# Patient Record
Sex: Male | Born: 1985 | Race: White | Hispanic: No | Marital: Married | Smoking: Never smoker
Health system: Southern US, Community
[De-identification: ages and names within clinical notes are randomized; demographics above are authoritative.]

## PROBLEM LIST (undated history)

## (undated) DIAGNOSIS — E079 Disorder of thyroid, unspecified: Secondary | ICD-10-CM

## (undated) DIAGNOSIS — F32A Depression, unspecified: Secondary | ICD-10-CM

## (undated) DIAGNOSIS — F329 Major depressive disorder, single episode, unspecified: Secondary | ICD-10-CM

## (undated) DIAGNOSIS — B019 Varicella without complication: Secondary | ICD-10-CM

## (undated) HISTORY — DX: Depression, unspecified: F32.A

## (undated) HISTORY — DX: Major depressive disorder, single episode, unspecified: F32.9

## (undated) HISTORY — DX: Varicella without complication: B01.9

## (undated) HISTORY — DX: Disorder of thyroid, unspecified: E07.9

---

## 2018-01-28 ENCOUNTER — Ambulatory Visit: Payer: Managed Care, Other (non HMO) | Admitting: Family Medicine

## 2018-01-28 ENCOUNTER — Encounter: Payer: Self-pay | Admitting: Family Medicine

## 2018-01-28 ENCOUNTER — Ambulatory Visit: Payer: Self-pay | Admitting: Family Medicine

## 2018-01-28 ENCOUNTER — Ambulatory Visit (INDEPENDENT_AMBULATORY_CARE_PROVIDER_SITE_OTHER): Payer: Managed Care, Other (non HMO)

## 2018-01-28 VITALS — BP 126/78 | HR 70 | Temp 97.9°F | Ht 69.0 in | Wt 237.8 lb

## 2018-01-28 DIAGNOSIS — M25572 Pain in left ankle and joints of left foot: Secondary | ICD-10-CM | POA: Diagnosis not present

## 2018-01-28 DIAGNOSIS — M79645 Pain in left finger(s): Secondary | ICD-10-CM

## 2018-01-28 DIAGNOSIS — Z803 Family history of malignant neoplasm of breast: Secondary | ICD-10-CM | POA: Insufficient documentation

## 2018-01-28 DIAGNOSIS — L4050 Arthropathic psoriasis, unspecified: Secondary | ICD-10-CM | POA: Insufficient documentation

## 2018-01-28 DIAGNOSIS — M25531 Pain in right wrist: Secondary | ICD-10-CM | POA: Diagnosis not present

## 2018-01-28 DIAGNOSIS — M25571 Pain in right ankle and joints of right foot: Secondary | ICD-10-CM

## 2018-01-28 DIAGNOSIS — E039 Hypothyroidism, unspecified: Secondary | ICD-10-CM | POA: Diagnosis not present

## 2018-01-28 DIAGNOSIS — M25532 Pain in left wrist: Secondary | ICD-10-CM | POA: Diagnosis not present

## 2018-01-28 DIAGNOSIS — G8929 Other chronic pain: Secondary | ICD-10-CM

## 2018-01-28 DIAGNOSIS — M2550 Pain in unspecified joint: Secondary | ICD-10-CM

## 2018-01-28 DIAGNOSIS — M542 Cervicalgia: Secondary | ICD-10-CM

## 2018-01-28 DIAGNOSIS — M79644 Pain in right finger(s): Secondary | ICD-10-CM

## 2018-01-28 LAB — TSH: TSH: 8.64 u[IU]/mL — AB (ref 0.35–4.50)

## 2018-01-28 MED ORDER — DICLOFENAC SODIUM 1 % TD GEL: TRANSDERMAL | 1 refills | Status: DC

## 2018-01-28 MED ORDER — LEVOTHYROXINE SODIUM 50 MCG PO TABS: 50.0000 ug | ORAL_TABLET | Freq: Every day | ORAL | 0 refills | Status: DC

## 2018-01-28 MED ORDER — DICLOFENAC SODIUM 75 MG PO TBEC: 75.0000 mg | DELAYED_RELEASE_TABLET | Freq: Two times a day (BID) | ORAL | 0 refills | Status: DC

## 2018-01-28 MED ORDER — LEVOTHYROXINE SODIUM 100 MCG PO TABS: 100.0000 ug | ORAL_TABLET | Freq: Every day | ORAL | 3 refills | Status: DC

## 2018-01-28 NOTE — Assessment & Plan Note (Signed)
No red flag signs or symptoms.  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  We will check plain films of his neck, hands, and ankles today.  I will start Voltaren gel for his hand pain.  I will also give diclofenac 75 mg twice daily as needed for his other joint pain.  Advised him to follow-up with sports medicine for for further evaluation and management.  This referral was placed today.

## 2018-01-28 NOTE — Assessment & Plan Note (Signed)
We will start back Synthroid at 50 mcg daily for the next 2 weeks.  Check TSH today.  Will likely need to increase back to 100 mcg daily in a couple of weeks if does well at 50 mcg dose.

## 2018-01-28 NOTE — Patient Instructions (Signed)
Start 50mcg of synthroid today. Do this for 2 weeks, then increase to 100mcg. We will check your thyroid level today.  We will also check xrays.  Use the diclofenac pill and gel as needed.  Please come back to see Dr Berline Choughigby soon.  Please come back at some point for your annual physical. I would like to see you once yearly.  Take care, Dr Jimmey RalphParker

## 2018-01-28 NOTE — Progress Notes (Signed)
Subjective:  Theodore Mcdonald is a 32 y.o. male who presents today with a chief complaint of hypothyroidism and to establish care.   HPI:  Patient has been in [place] for about 2 years.  Was previously living in [place].  Originally from [place], [place].  Currently works at [employer] where he works about 80 hours/week.  Hypothyroidism, new problem Diagnosed about 6 years ago.  Was last on Synthroid 100 mcg daily.  He has been off all medications for the past 3 months.  No chest pain or shortness of breath.  He has not noticed any sort of changes since being off all of his medications.  No decreased energy levels.  No constipation.  No hair or skin changes.  Polyarthralgia, new problem Patient with pain in his joints at several sites over the last several years.  Symptoms have been gradually worsening over the past couple years.  Notes pain in his neck that started a couple of years ago.  Also with some decreased range of motion in his neck.  He takes naproxen and ibuprofen as needed which help him with his pain.  Also notes pain in his bilateral hands and wrists that are worsened over the last year or 2.  His hand, finger, and wrist pain is worse with writing and with gripping.  Patient has very active at his work and does a lot of lifting.  He has recently had to modify his lifting technique to help him with his hand pain.  Patient additionally notes severe pain along the inner aspect of his ankles bilaterally.  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  Pain is also worsened over the last couple of years.  No weakness or numbness.  No fever or chills.  ROS: Per HPI, otherwise a complete review of systems was negative.   PMH:  The following were reviewed and entered/updated in epic: Past Medical History:  Diagnosis Date  . Thyroid disease    Patient Active Problem List   Diagnosis Date Noted  . Family history of  breast cancer 01/28/2018  . Hypothyroidism 01/28/2018  . Polyarthralgia 01/28/2018   History reviewed. No pertinent surgical history.  Family history of breast cancer. Was told needs mammogram.   Medications- reviewed and updated Current Outpatient Medications  Medication Sig Dispense Refill  . ibuprofen (ADVIL,MOTRIN) 800 MG tablet Take 800 mg by mouth every 8 (eight) hours as needed.    . naproxen (NAPROSYN) 250 MG tablet Take 250 mg by mouth daily as needed.    . diclofenac (VOLTAREN) 75 MG EC tablet Take 1 tablet (75 mg total) by mouth 2 (two) times daily. 30 tablet 0  . diclofenac sodium (VOLTAREN) 1 % GEL Use 4 times daily as needed 100 g 1  . levothyroxine (SYNTHROID, LEVOTHROID) 100 MCG tablet Take 1 tablet (100 mcg total) by mouth daily. 90 tablet 3  . levothyroxine (SYNTHROID, LEVOTHROID) 50 MCG tablet Take 1 tablet (50 mcg total) by mouth daily. 14 tablet 0   No current facility-administered medications for this visit.    Allergies-reviewed and updated No Known Allergies  Social History   Socioeconomic History  . Marital status: Married    Spouse name: None  . Number of children: 4  . Years of education: None  . Highest education level: None  Social Needs  . Financial resource strain: None  . Food insecurity - worry: None  . Food insecurity - inability: None  .  Transportation needs - medical: None  . Transportation needs - non-medical: None  Occupational History  . None  Tobacco Use  . Smoking status: Never Smoker  . Smokeless tobacco: Never Used  Substance and Sexual Activity  . Alcohol use: Yes  . Drug use: No  . Sexual activity: Yes    Partners: Female  Other Topics Concern  . None  Social History Narrative  . None   Objective:  Physical Exam: BP 126/78 (BP Location: Left Arm, Patient Position: Sitting, Cuff Size: Normal)   Pulse 70   Temp 97.9 F (36.6 C) (Oral)   Ht 5\' 9"  (1.753 m)   Wt 237 lb 12.8 oz (107.9 kg)   SpO2 98%   BMI 35.12 kg/m    Gen: NAD, resting comfortably CV: RRR with no murmurs appreciated Pulm: NWOB, CTAB with no crackles, wheezes, or rhonchi GI: Normal bowel sounds present. Soft, Nontender, Nondistended. MSK:  -Neck: No deformities.  Mildly tender to palpation along paraspinal muscles bilaterally.  Normal extension and flexion.  Somewhat limited with leftward and rightward rotation. -Hands: No deformities.  Tender to palpation along first [facility] joint and second CMP joint bilaterally.  Pain worsened with joint loading.  Thumb-index finger opposition testing intact bilaterally.  Normal strength with thumb extension bilaterally.  Finkelstein negative bilaterally.  Neurovascularly intact distally. -Ankles: No deformities.  Tender to palpation along medial joint line bilaterally.  Anterior drawer sign negative bilaterally.  Talar tilt negative bilaterally. Skin: Warm, dry Neuro: Grossly normal, moves all extremities Psych: Normal affect and thought content  Assessment/Plan:  Hypothyroidism We will start back Synthroid at 50 mcg daily for the next 2 weeks.  Check TSH today.  Will likely need to increase back to 100 mcg daily in a couple of weeks if does well at 50 mcg dose.  Polyarthralgia No red flag signs or symptoms.  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  We will check plain films of his neck, hands, and ankles today.  I will start Voltaren gel for his hand pain.  I will also give diclofenac 75 mg twice daily as needed for his other joint pain.  Advised him to follow-up with sports medicine for for further evaluation and management.  This referral was placed today.  Preventative healthcare Patient recently had cholesterol screening done at work.  He will bring these records to the office.  He will follow-up soon for his CPE.  Katina Degree. Jimmey Ralph, MD 01/28/2018 2:57 PM

## 2018-02-13 ENCOUNTER — Other Ambulatory Visit: Payer: Self-pay | Admitting: Family Medicine

## 2018-02-20 ENCOUNTER — Other Ambulatory Visit: Payer: Self-pay

## 2018-02-20 MED ORDER — DICLOFENAC SODIUM 1 % TD GEL: TRANSDERMAL | 0 refills | Status: DC

## 2018-03-16 ENCOUNTER — Other Ambulatory Visit: Payer: Self-pay | Admitting: Family Medicine

## 2018-03-28 ENCOUNTER — Other Ambulatory Visit: Payer: Self-pay | Admitting: Family Medicine

## 2018-05-08 ENCOUNTER — Other Ambulatory Visit: Payer: Self-pay | Admitting: Family Medicine

## 2018-06-08 ENCOUNTER — Other Ambulatory Visit: Payer: Self-pay | Admitting: Family Medicine

## 2018-06-26 IMAGING — DX DG CERVICAL SPINE COMPLETE 4+V
6 series · 6 of 6 positions shown · non-contrast
Comparison: None.

CLINICAL DATA: Neck pain with decreased range of motion for several
months

EXAM:
CERVICAL SPINE - COMPLETE 4+ VIEW

[cervical spine lat (1 of 2)]
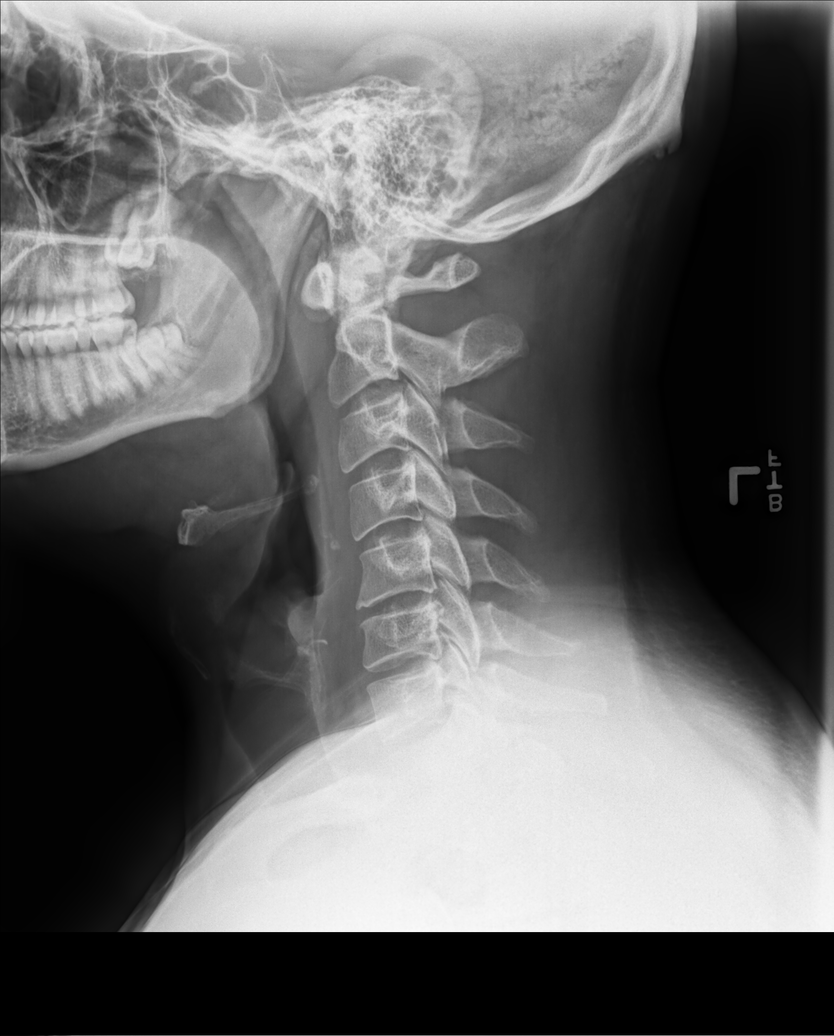

[cervical spine oblique (1 of 2)]
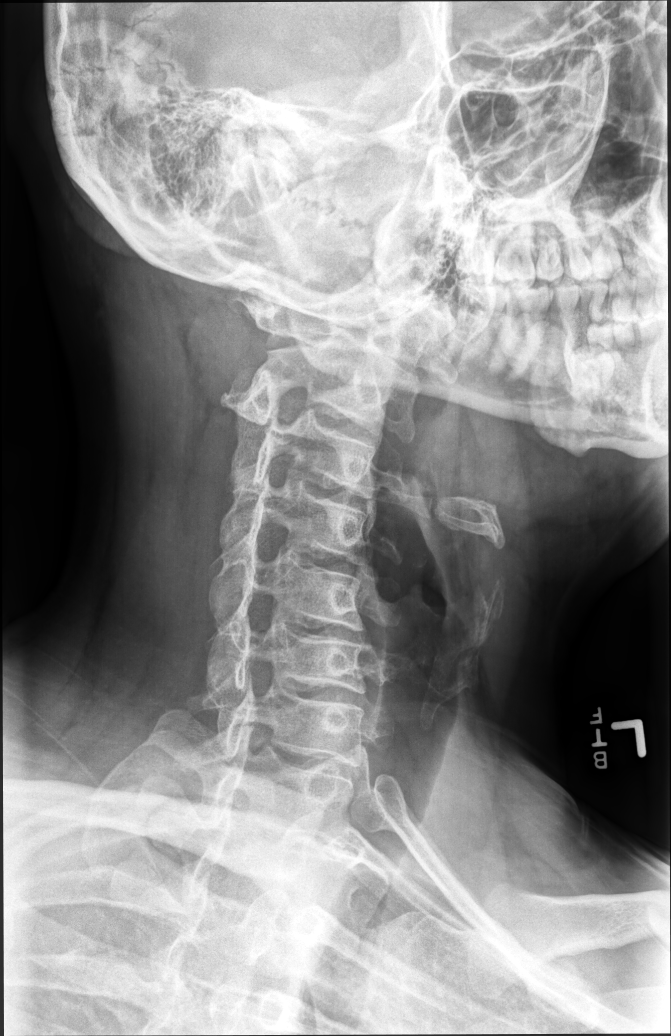

[cervical spine oblique (2 of 2)]
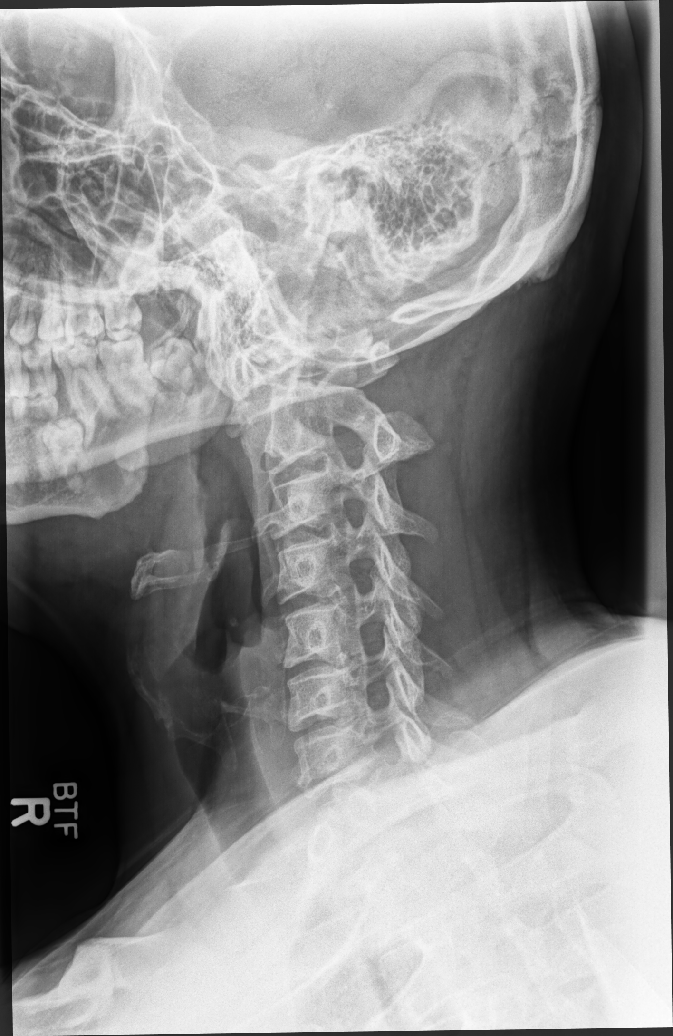

[cervical spine ap (1 of 2)]
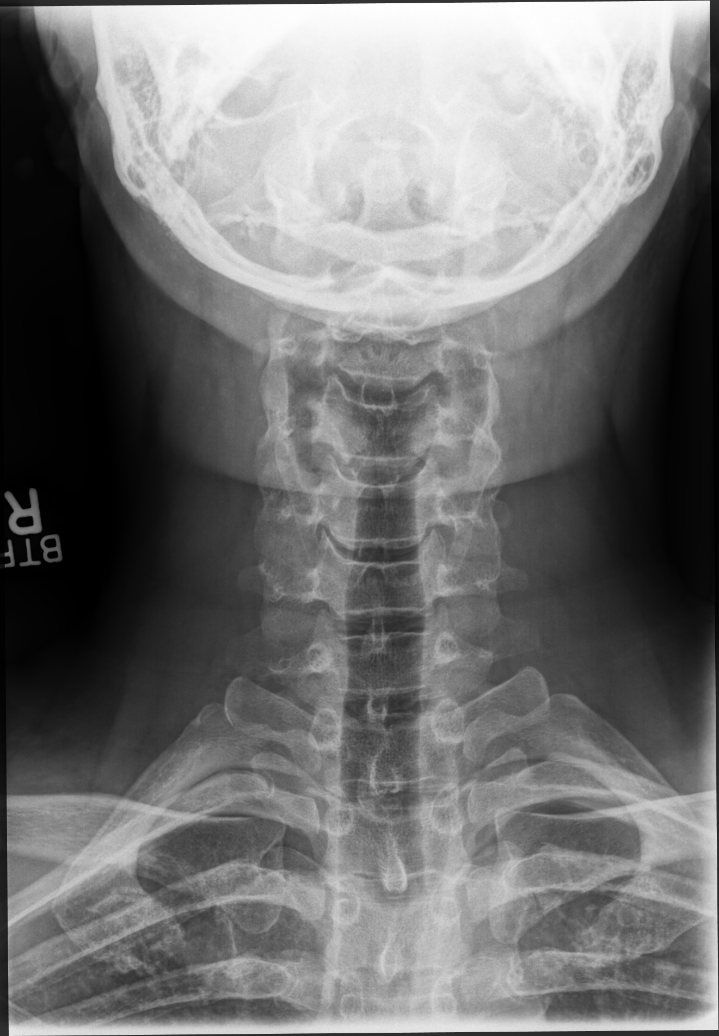

[cervical spine ap (2 of 2)]
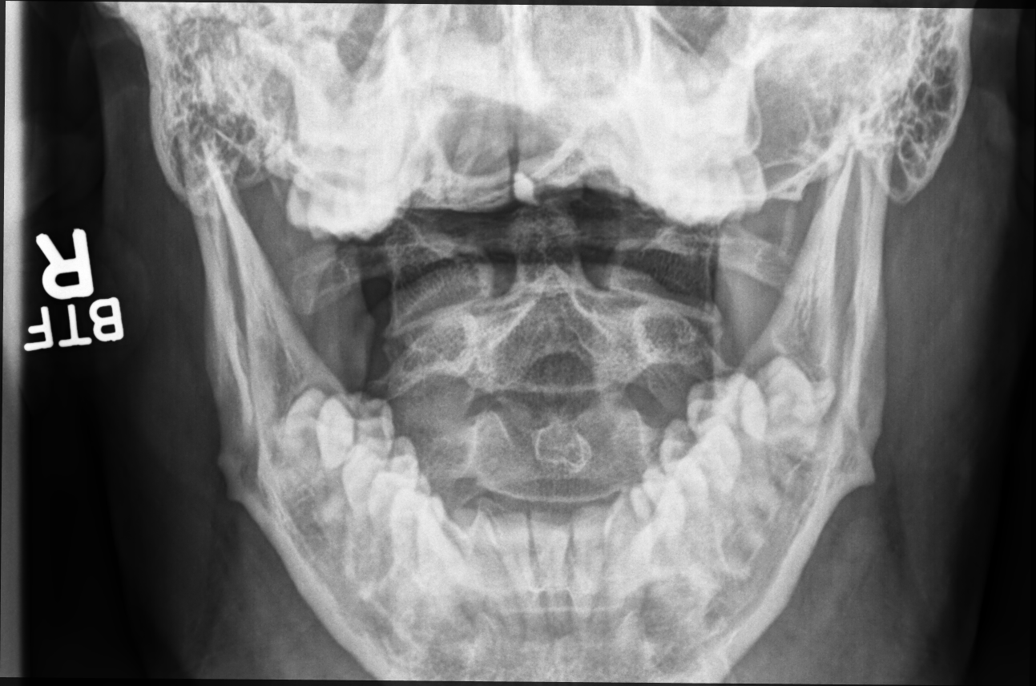

[cervical spine lat (2 of 2)]
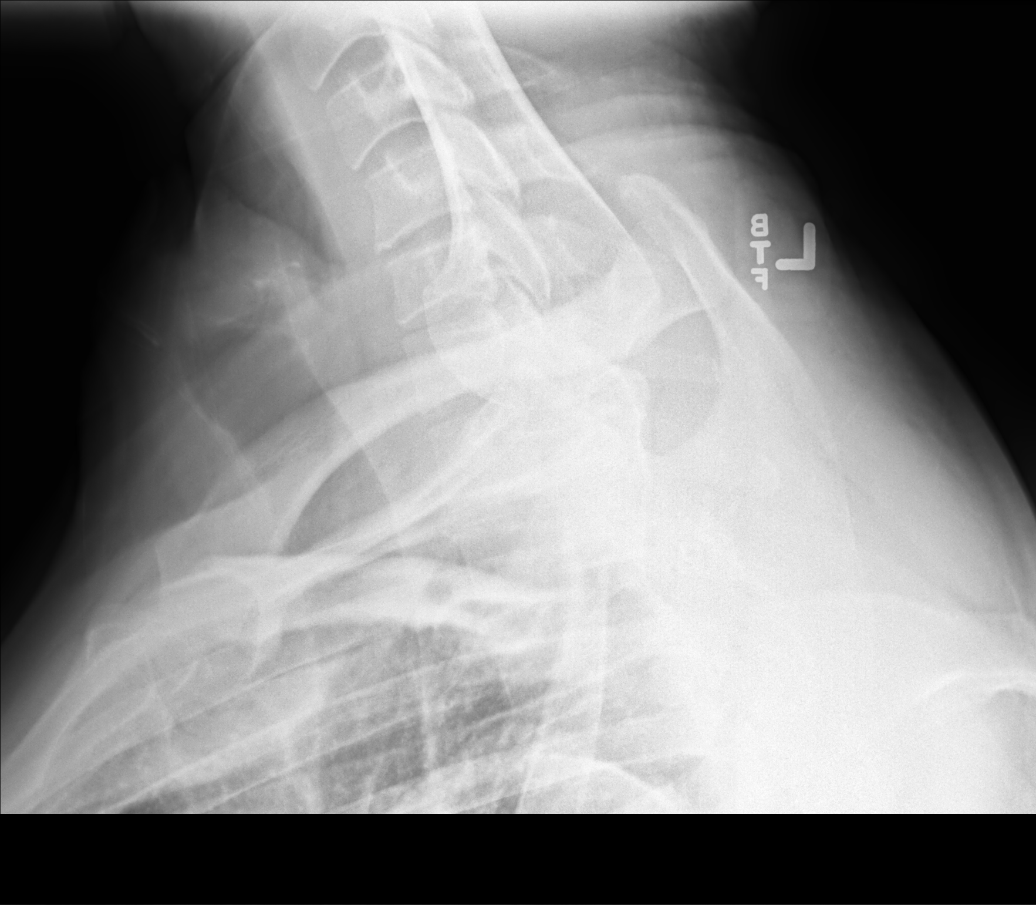

[6 of 6 positions shown; findings below may reference images not displayed]

FINDINGS: Straightening of the cervical spine. Prevertebral soft tissue
thickness upper limits of normal. Vertebral body heights are normal.
Mild degenerative changes at C5-C6 and C6-C7. The dens and lateral
masses are within normal limits.
IMPRESSION: Mild degenerative changes at C5-C6 and C6-C7.

## 2018-06-26 IMAGING — DX DG HAND COMPLETE 3+V*R*
3 series · 3 of 3 positions shown · non-contrast
Comparison: None.

CLINICAL DATA: Pain in the hands for several years most severe in
the thumbs and index finger

EXAM:
RIGHT HAND - COMPLETE 3+ VIEW

[hand pa]
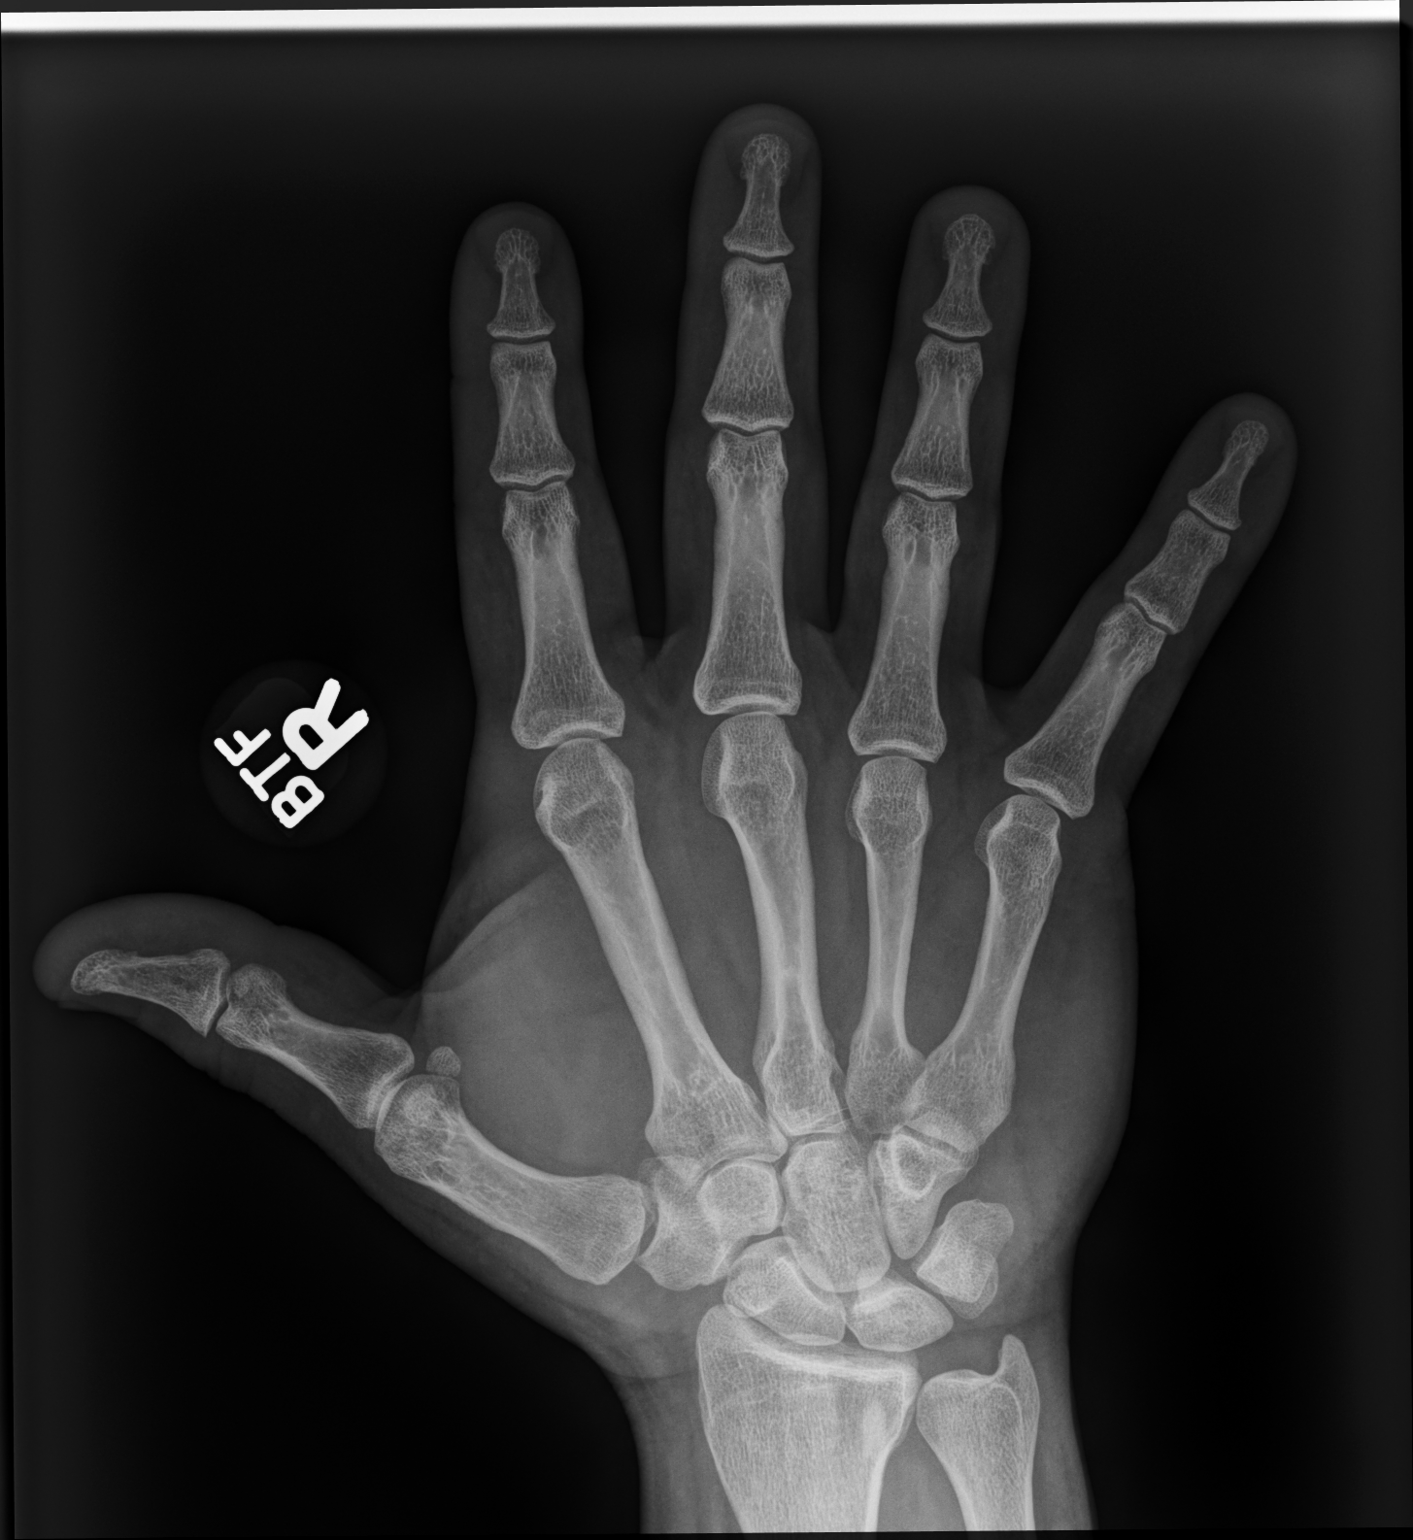

[hand oblique]
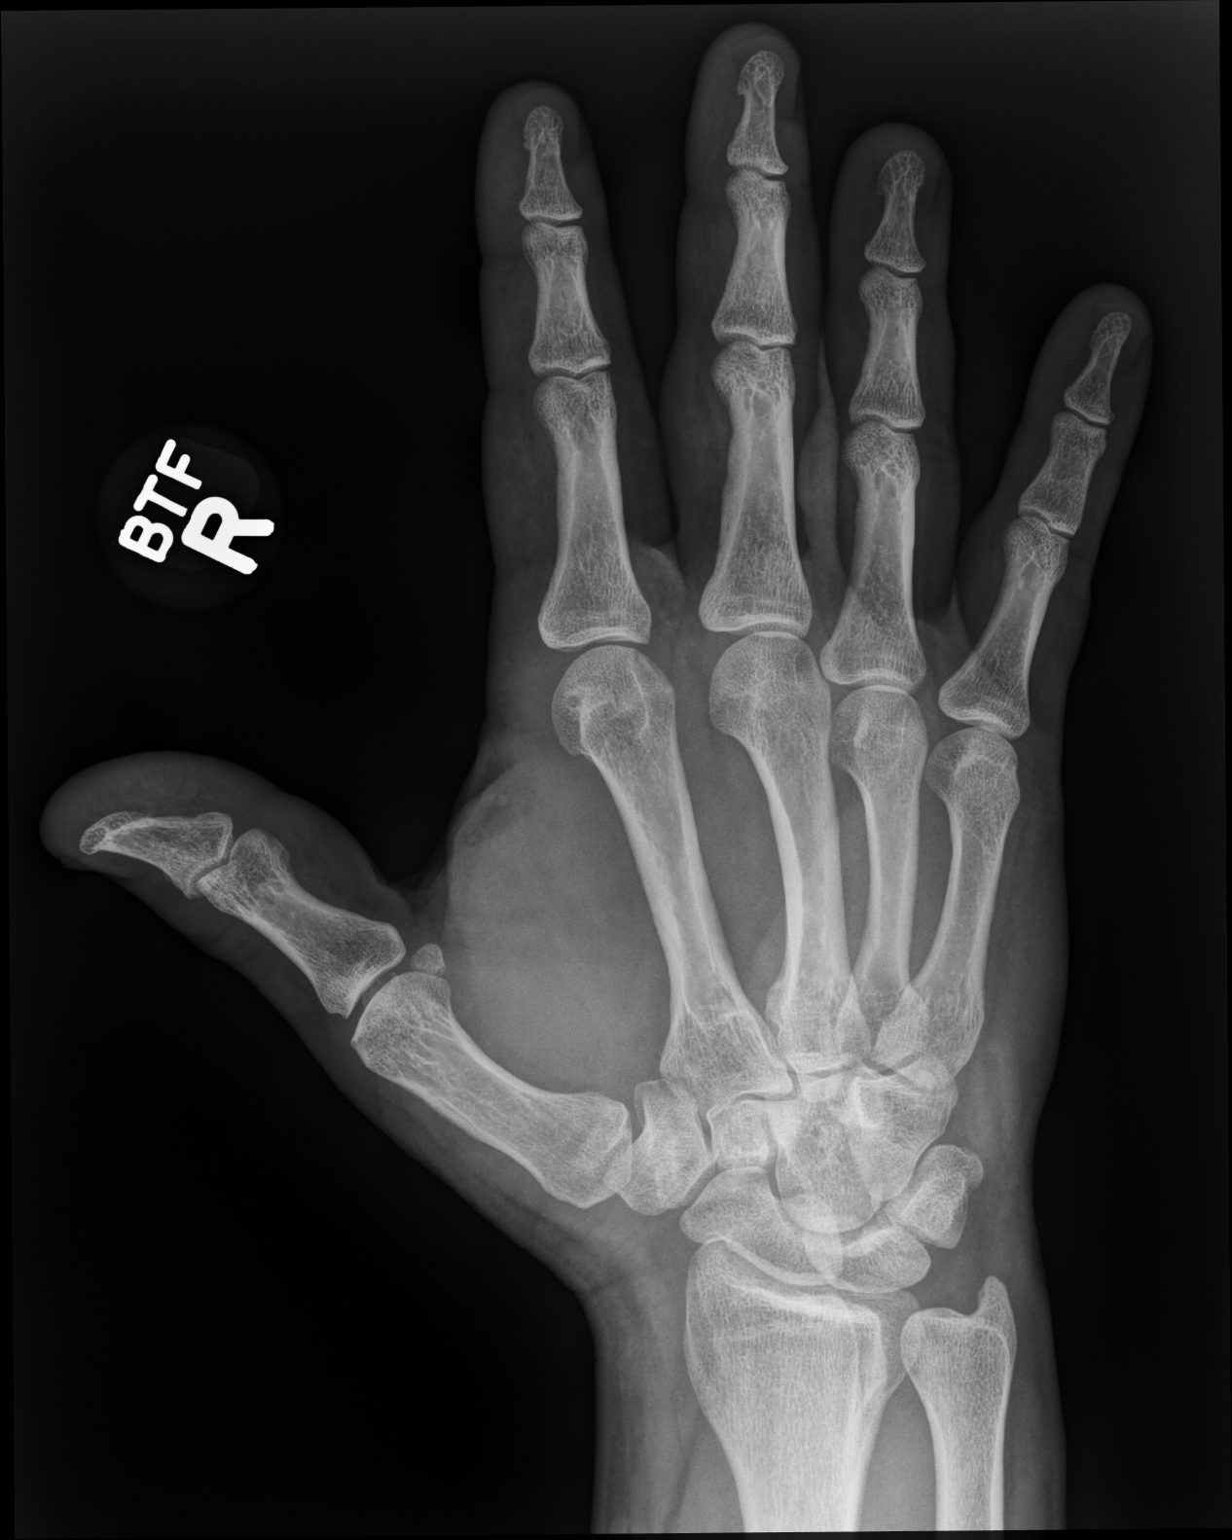

[hand lat]
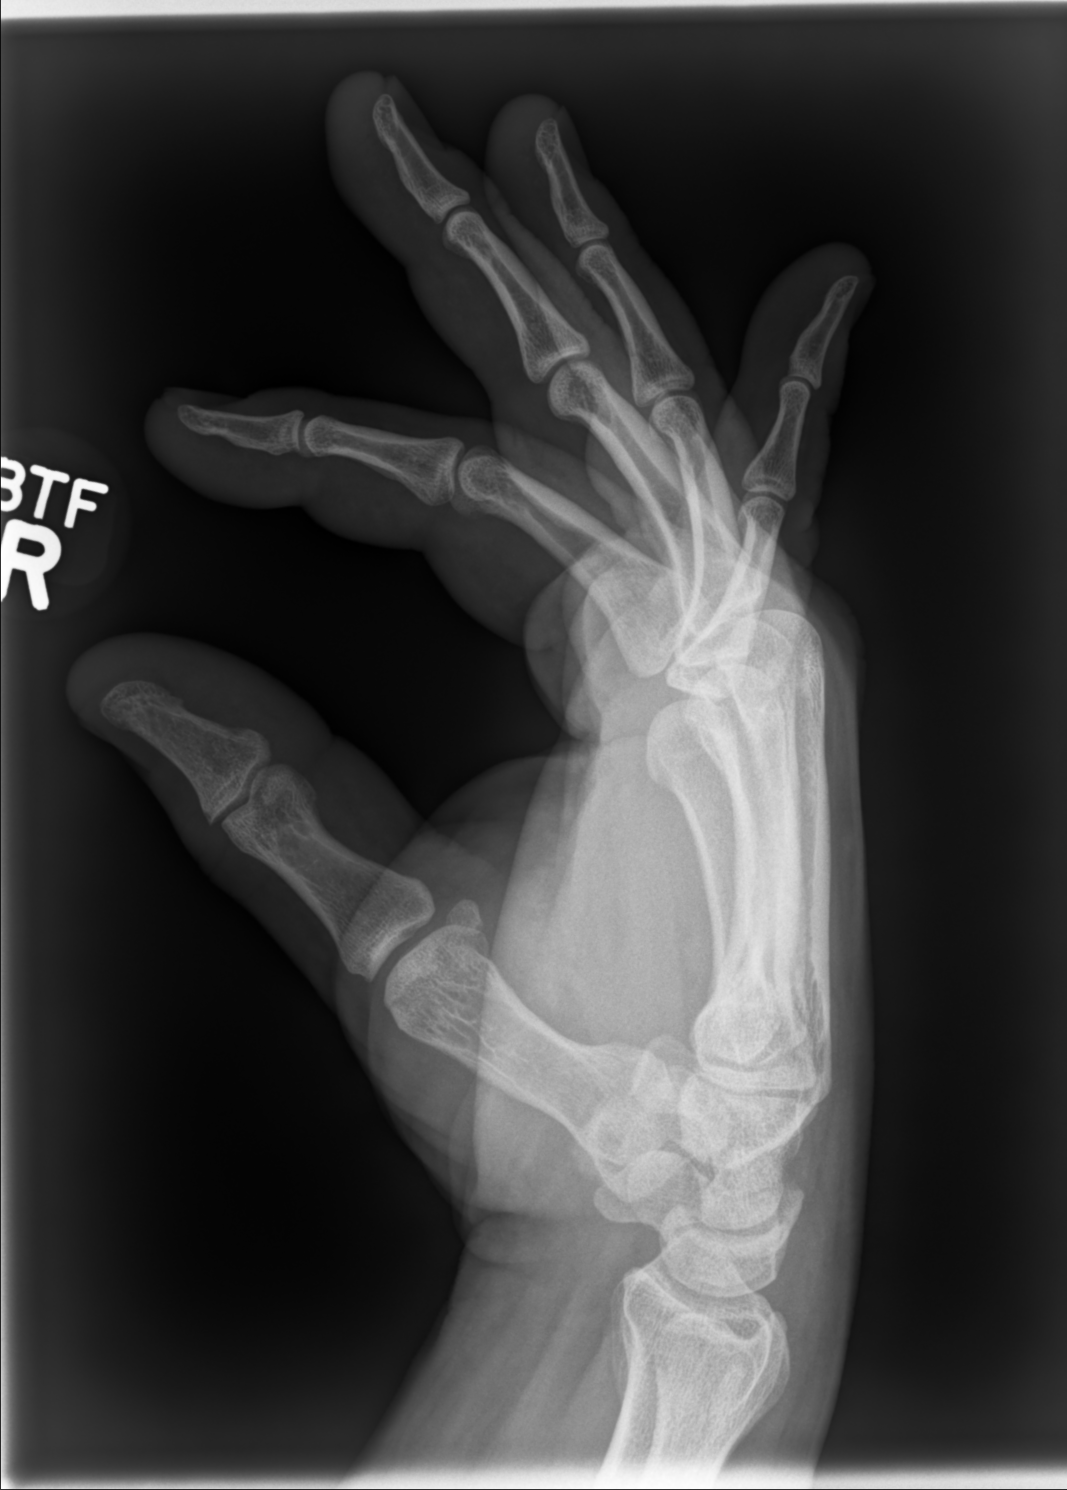

[3 of 3 positions shown; findings below may reference images not displayed]

FINDINGS: There is no evidence of fracture or dislocation. There is no
evidence of arthropathy or other focal bone abnormality. Soft
tissues are unremarkable.
IMPRESSION: Negative.

## 2018-07-01 ENCOUNTER — Encounter: Payer: Self-pay | Admitting: Family Medicine

## 2018-07-01 ENCOUNTER — Ambulatory Visit: Payer: Managed Care, Other (non HMO) | Admitting: Family Medicine

## 2018-07-01 VITALS — BP 122/74 | HR 71 | Temp 98.5°F | Ht 69.0 in | Wt 246.0 lb

## 2018-07-01 DIAGNOSIS — M2550 Pain in unspecified joint: Secondary | ICD-10-CM

## 2018-07-01 DIAGNOSIS — E039 Hypothyroidism, unspecified: Secondary | ICD-10-CM | POA: Diagnosis not present

## 2018-07-01 LAB — COMPREHENSIVE METABOLIC PANEL
ALT: 31 U/L (ref 0–53)
AST: 16 U/L (ref 0–37)
Albumin: 4.3 g/dL (ref 3.5–5.2)
Alkaline Phosphatase: 54 U/L (ref 39–117)
BUN: 16 mg/dL (ref 6–23)
CALCIUM: 9.6 mg/dL (ref 8.4–10.5)
CHLORIDE: 103 meq/L (ref 96–112)
CO2: 29 mEq/L (ref 19–32)
Creatinine, Ser: 0.94 mg/dL (ref 0.40–1.50)
GFR: 97.37 mL/min (ref >60.00)
Glucose, Bld: 104 mg/dL — ABNORMAL HIGH (ref 70–99)
POTASSIUM: 4.3 meq/L (ref 3.5–5.1)
Sodium: 140 mEq/L (ref 135–145)
Total Bilirubin: 0.7 mg/dL (ref 0.2–1.2)
Total Protein: 6.9 g/dL (ref 6.0–8.3)

## 2018-07-01 LAB — CBC
HEMATOCRIT: 40.7 % (ref 39.0–52.0)
HEMOGLOBIN: 13.9 g/dL (ref 13.0–17.0)
MCHC: 34.3 g/dL (ref 30.0–36.0)
MCV: 88.4 fl (ref 78.0–100.0)
PLATELETS: 287 10*3/uL (ref 150.0–400.0)
RBC: 4.6 Mil/uL (ref 4.22–5.81)
RDW: 13.6 % (ref 11.5–15.5)
WBC: 7.4 10*3/uL (ref 4.0–10.5)

## 2018-07-01 LAB — C-REACTIVE PROTEIN: CRP: 2.5 mg/dL (ref 0.5–20.0)

## 2018-07-01 LAB — URIC ACID: Uric Acid, Serum: 6.5 mg/dL (ref 4.0–7.8)

## 2018-07-01 LAB — TSH: TSH: 5.12 u[IU]/mL — ABNORMAL HIGH (ref 0.35–4.50)

## 2018-07-01 LAB — SEDIMENTATION RATE: SED RATE: 21 mm/h — AB (ref 0–15)

## 2018-07-01 NOTE — Progress Notes (Signed)
   Subjective:  Theodore Mcdonald is a 32 y.o. male who presents today with a chief complaint of hypothyroidism.   HPI:  Hypothyroidism, chronic problem Patient seen about 5 months ago for this.  Had elevated TSH to 8.23 and we increased his Synthroid dose to 100 mcg daily.  He has done well this dose.  No reported side effects.  Polyarthralgia, chronic problem, worsening Patient also seen about 5 months ago for this.  Unfortunately, he has had worsening pain in his hands bilaterally.  He has had significant stiffness and swelling to the area as well.  Sometimes difficult to make a fist.  He was prescribed diclofenac by mouth as well as diclofenac gel at his last office visit which worked well, however he does not take daily as he does not like to take daily medications.  No rashes.  No fevers or chills.  Symptoms have progressed to the point where they are interfering with his work and ability to perform activities of daily living.  For example, he had to have his wife help him put on socks due to the swelling and pain in his hands.  He notices pain gets worse when he does not take his anti-inflammatories.  Pain is also worse first thing in the morning.  ROS: Per HPI  PMH: He reports that he has never smoked. He has never used smokeless tobacco. He reports that he drinks alcohol. He reports that he does not use drugs.  Objective:  Physical Exam: BP 122/74 (BP Location: Left Arm, Patient Position: Sitting, Cuff Size: Large)   Pulse 71   Temp 98.5 F (36.9 C) (Oral)   Ht 5\' 9"  (1.753 m)   Wt 246 lb (111.6 kg)   SpO2 98%   BMI 36.33 kg/m   Gen: NAD, resting comfortably CV: RRR with no murmurs appreciated Pulm: NWOB, CTAB with no crackles, wheezes, or rhonchi MSK:  -Hands: No deformities.  Tender to palpation along all metacarpophalangeal joints, most prominent at third MCP joint bilaterally.  Strength intact throughout.  Neurovascular intact distally.  Assessment/Plan:   Polyarthralgia Unclear etiology.  His x-rays performed several months ago did not have any signs of significant degenerative changes.  We will check blood work today to rule out other causes.  Check ANA, CBC, CMET, sed rate, CRP, uric acid, and rheumatoid factor.  He will follow-up with sports medicine within the next few weeks for further evaluation and treatment.  Voltaren 75 mg twice daily and Voltaren topical gel as needed.  Also recommended Tylenol 1000 mg 3 times daily.  Patient also stated he would be trying CBD oil.  Hypothyroidism Continue Synthroid 100 mcg daily.  Check TSH today.  Katina Degreealeb M. Jimmey RalphParker, MD 07/01/2018 9:11 AM

## 2018-07-01 NOTE — Patient Instructions (Signed)
It was very nice to see you today!  We will check blood work to make sure there is nothing else going.   We will continue your current dose of thyroid medication.    Please take 1000 mg of Tylenol 3 times daily as needed.  Please continue your other anti-inflammatories as needed.  Please come back to see Dr. Berline Choughigby soon.  Take care, Dr Jimmey RalphParker

## 2018-07-01 NOTE — Assessment & Plan Note (Signed)
Continue Synthroid 100 mcg daily.  Check TSH today. 

## 2018-07-01 NOTE — Assessment & Plan Note (Addendum)
Unclear etiology.  His x-rays performed several months ago did not have any signs of significant degenerative changes.  We will check blood work today to rule out other causes.  Check ANA, CBC, CMET, sed rate, CRP, uric acid, and rheumatoid factor.  He will follow-up with sports medicine within the next few weeks for further evaluation and treatment.  Voltaren 75 mg twice daily and Voltaren topical gel as needed.  Also recommended Tylenol 1000 mg 3 times daily.  Patient also stated he would be trying CBD oil.

## 2018-07-03 LAB — ANA: ANA: NEGATIVE

## 2018-07-03 LAB — RHEUMATOID FACTOR

## 2018-07-08 NOTE — Progress Notes (Signed)
Please inform patient of the following:  His thyroid test is still a little high. We should increase his synthroid to 112mcg daily. I would like to check again in 4-6 weeks. Please send in new Rx if needed.   The rest of his blood work is essentially normal. I do not have a clear diagnosis for his hand pains. I would like for him to see Dr Berline Choughigby for further evaluation and management.  Katina Degreealeb M. Jimmey RalphParker, MD 07/08/2018 1:01 PM

## 2018-07-09 ENCOUNTER — Other Ambulatory Visit: Payer: Self-pay

## 2018-07-09 DIAGNOSIS — E039 Hypothyroidism, unspecified: Secondary | ICD-10-CM

## 2018-07-09 MED ORDER — LEVOTHYROXINE SODIUM 112 MCG PO TABS: 112.0000 ug | ORAL_TABLET | Freq: Every day | ORAL | 3 refills | Status: DC

## 2018-07-16 ENCOUNTER — Other Ambulatory Visit: Payer: Self-pay | Admitting: Family Medicine

## 2018-08-13 ENCOUNTER — Other Ambulatory Visit: Payer: Self-pay | Admitting: Family Medicine

## 2018-08-19 ENCOUNTER — Other Ambulatory Visit: Payer: Managed Care, Other (non HMO)

## 2018-08-21 ENCOUNTER — Other Ambulatory Visit (INDEPENDENT_AMBULATORY_CARE_PROVIDER_SITE_OTHER): Payer: Managed Care, Other (non HMO)

## 2018-08-21 DIAGNOSIS — E039 Hypothyroidism, unspecified: Secondary | ICD-10-CM | POA: Diagnosis not present

## 2018-08-21 LAB — TSH: TSH: 7.37 u[IU]/mL — AB (ref 0.35–4.50)

## 2018-08-26 NOTE — Progress Notes (Signed)
Please inform patient of the following:  Thyroid number is still high. Please increase synthroid to 125mcg and we can recheck in 4-6 weeks.  Katina Degreealeb M. Jimmey RalphParker, MD 08/26/2018 8:06 AM

## 2018-08-28 ENCOUNTER — Other Ambulatory Visit: Payer: Self-pay

## 2018-09-04 ENCOUNTER — Other Ambulatory Visit: Payer: Self-pay

## 2018-09-04 DIAGNOSIS — E039 Hypothyroidism, unspecified: Secondary | ICD-10-CM

## 2018-09-14 ENCOUNTER — Other Ambulatory Visit: Payer: Self-pay | Admitting: Family Medicine

## 2018-10-17 ENCOUNTER — Other Ambulatory Visit: Payer: Self-pay | Admitting: Family Medicine

## 2019-01-01 ENCOUNTER — Other Ambulatory Visit: Payer: Self-pay | Admitting: Family Medicine

## 2019-01-29 ENCOUNTER — Other Ambulatory Visit: Payer: Self-pay | Admitting: Family Medicine

## 2019-02-20 ENCOUNTER — Telehealth: Payer: Self-pay | Admitting: *Deleted

## 2019-02-20 ENCOUNTER — Encounter: Payer: Self-pay | Admitting: Physician Assistant

## 2019-02-20 ENCOUNTER — Ambulatory Visit (INDEPENDENT_AMBULATORY_CARE_PROVIDER_SITE_OTHER): Payer: 59 | Admitting: Physician Assistant

## 2019-02-20 VITALS — BP 118/78 | HR 96 | Temp 98.9°F

## 2019-02-20 DIAGNOSIS — M2550 Pain in unspecified joint: Secondary | ICD-10-CM

## 2019-02-20 DIAGNOSIS — R059 Cough, unspecified: Secondary | ICD-10-CM

## 2019-02-20 DIAGNOSIS — R05 Cough: Secondary | ICD-10-CM | POA: Diagnosis not present

## 2019-02-20 MED ORDER — AZITHROMYCIN 250 MG PO TABS: ORAL_TABLET | ORAL | 0 refills | Status: DC

## 2019-02-20 NOTE — Patient Instructions (Addendum)
It was great to see you!  Flu and strep test negative.  Start azithromycin for bronchitis.   Push fluids and get plenty of rest. Please return if you are not improving as expected, or if you have high fevers (>101.5) or difficulty swallowing or worsening productive cough.  Call clinic with questions.  I hope you start feeling better soon!

## 2019-02-20 NOTE — Progress Notes (Signed)
Theodore Mcdonald is a 33 y.o. male here for a new problem.  History of Present Illness:   Chief Complaint  Patient presents with  . Cough    HPI   Patient with cough, congestion, sore throat.   Denies: fevers, chills, body aches. Good appetite. Still working. Has good energy. History of bronchitis. Former smoker.  Has tried nothing for his cough.  3 weeks ago his child came home from an outing and was sick. Since then, everyone in the family has been sick. Denies any known COVID-19 contacts.  He is also here asking about a referral to rheumatology for his RA. States that it is getting significantly worse with time. And his current medication regimen is not managing it well.  Past Medical History:  Diagnosis Date  . Chicken pox   . Depression   . Thyroid disease      Social History   Socioeconomic History  . Marital status: Married    Spouse name: Not on file  . Number of children: 4  . Years of education: Not on file  . Highest education level: Not on file  Occupational History  . Not on file  Social Needs  . Financial resource strain: Not on file  . Food insecurity:    Worry: Not on file    Inability: Not on file  . Transportation needs:    Medical: Not on file    Non-medical: Not on file  Tobacco Use  . Smoking status: Never Smoker  . Smokeless tobacco: Never Used  Substance and Sexual Activity  . Alcohol use: Yes  . Drug use: No  . Sexual activity: Yes    Partners: Female  Lifestyle  . Physical activity:    Days per week: Not on file    Minutes per session: Not on file  . Stress: Not on file  Relationships  . Social connections:    Talks on phone: Not on file    Gets together: Not on file    Attends religious service: Not on file    Active member of club or organization: Not on file    Attends meetings of clubs or organizations: Not on file    Relationship status: Not on file  . Intimate partner violence:    Fear of current or ex partner: Not on  file    Emotionally abused: Not on file    Physically abused: Not on file    Forced sexual activity: Not on file  Other Topics Concern  . Not on file  Social History Narrative  . Not on file    No past surgical history on file.  Family History  Problem Relation Age of Onset  . Arthritis Mother   . Cancer Mother   . Depression Mother   . Diabetes Mother   . Arthritis Father   . Depression Father   . Mental illness Father   . Miscarriages / Stillbirths Sister   . Arthritis Maternal Grandmother   . Cancer Maternal Grandmother   . Diabetes Maternal Grandmother   . Heart disease Maternal Grandmother   . Cancer Maternal Grandfather   . Cancer Paternal Grandmother   . Diabetes Paternal Grandmother   . Arthritis Paternal Grandfather   . Cancer Paternal Grandfather   . COPD Paternal Grandfather     No Known Allergies  Current Medications:   Current Outpatient Medications:  .  diclofenac (VOLTAREN) 75 MG EC tablet, TAKE 1 TABLET BY MOUTH TWICE A DAY, Disp: 60 tablet, Rfl:  0 .  levothyroxine (SYNTHROID, LEVOTHROID) 112 MCG tablet, Take 1 tablet (112 mcg total) by mouth daily., Disp: 90 tablet, Rfl: 3 .  naproxen (NAPROSYN) 250 MG tablet, Take 250 mg by mouth daily as needed., Disp: , Rfl:  .  azithromycin (ZITHROMAX Z-PAK) 250 MG tablet, Take 2 tablets ( total of 500 mg) PO on day 1, then 1 tablet ( total of 250 mg) PO q24 x 4 days., Disp: 6 each, Rfl: 0 .  diclofenac sodium (VOLTAREN) 1 % GEL, USE 4 TIMES DAILY AS NEEDED (Patient not taking: Reported on 02/20/2019), Disp: 300 g, Rfl: 0 .  ibuprofen (ADVIL,MOTRIN) 800 MG tablet, Take 800 mg by mouth every 8 (eight) hours as needed., Disp: , Rfl:    Review of Systems:   Review of Systems  Constitutional: Negative for chills, fever, malaise/fatigue and weight loss.  HENT: Positive for congestion and sinus pain. Negative for ear pain.   Respiratory: Positive for cough. Negative for shortness of breath.   Cardiovascular: Negative  for chest pain, orthopnea, claudication and leg swelling.  Gastrointestinal: Negative for heartburn, nausea and vomiting.  Genitourinary: Negative for dysuria, frequency and urgency.  Neurological: Negative for dizziness, tingling and headaches.    Vitals:   Vitals:   02/20/19 1544  BP: 118/78  Pulse: 96  Temp: 98.9 F (37.2 C)  SpO2: 97%     There is no height or weight on file to calculate BMI.  Physical Exam:   Physical Exam Vitals signs and nursing note reviewed.  Constitutional:      General: He is not in acute distress.    Appearance: He is well-developed. He is not ill-appearing or toxic-appearing.  HENT:     Head: Normocephalic and atraumatic.     Right Ear: Tympanic membrane, ear canal and external ear normal. Tympanic membrane is not erythematous, retracted or bulging.     Left Ear: Tympanic membrane, ear canal and external ear normal. Tympanic membrane is not erythematous, retracted or bulging.     Nose: Nasal tenderness, congestion and rhinorrhea present.     Right Sinus: No maxillary sinus tenderness or frontal sinus tenderness.     Left Sinus: No maxillary sinus tenderness or frontal sinus tenderness.     Mouth/Throat:     Lips: Pink.     Mouth: Mucous membranes are moist.     Pharynx: Uvula midline. Posterior oropharyngeal erythema present.  Eyes:     General: Lids are normal.     Conjunctiva/sclera: Conjunctivae normal.  Neck:     Trachea: Trachea normal.  Cardiovascular:     Rate and Rhythm: Normal rate and regular rhythm.     Pulses: Normal pulses.     Heart sounds: Normal heart sounds, S1 normal and S2 normal.     Comments: No LE edema Pulmonary:     Effort: Pulmonary effort is normal.     Breath sounds: Normal breath sounds. No decreased breath sounds, wheezing, rhonchi or rales.  Lymphadenopathy:     Cervical: No cervical adenopathy.  Skin:    General: Skin is warm and dry.  Neurological:     Mental Status: He is alert.     GCS: GCS eye  subscore is 4. GCS verbal subscore is 5. GCS motor subscore is 6.  Psychiatric:        Speech: Speech normal.        Behavior: Behavior normal. Behavior is cooperative.     Results for orders placed or performed in visit on 08/21/18  TSH  Result Value Ref Range   TSH 7.37 (H) 0.35 - 4.50 uIU/mL    Assessment and Plan:   Duell was seen today for cough.  Diagnoses and all orders for this visit:  Polyarthralgia -     Ambulatory referral to Rheumatology  Cough No red flags on exam.  Will initiate azithromycin per orders. Discussed taking medications as prescribed. Reviewed return precautions including worsening fever, SOB, worsening cough or other concerns. Push fluids and rest. I recommend that patient follow-up if symptoms worsen or persist despite treatment x 7-10 days, sooner if needed.  Other orders -     azithromycin (ZITHROMAX Z-PAK) 250 MG tablet; Take 2 tablets ( total of 500 mg) PO on day 1, then 1 tablet ( total of 250 mg) PO q24 x 4 days.  . Reviewed expectations re: course of current medical issues. . Discussed self-management of symptoms. . Outlined signs and symptoms indicating need for more acute intervention. . Patient verbalized understanding and all questions were answered. . See orders for this visit as documented in the electronic medical record. . Patient received an After-Visit Summary.  Jarold Motto, PA-C

## 2019-02-20 NOTE — Telephone Encounter (Signed)
Left message calling regarding appt today at 3:20. Please come to the side door on the left side of the building, ring the bell and we will come out and get you. Any questions please call office.

## 2019-02-23 ENCOUNTER — Other Ambulatory Visit: Payer: Self-pay | Admitting: Family Medicine

## 2019-02-24 ENCOUNTER — Other Ambulatory Visit: Payer: Self-pay

## 2019-02-24 ENCOUNTER — Other Ambulatory Visit: Payer: Self-pay | Admitting: Family Medicine

## 2019-02-24 ENCOUNTER — Telehealth: Payer: Self-pay | Admitting: *Deleted

## 2019-02-24 ENCOUNTER — Other Ambulatory Visit: Payer: Self-pay | Admitting: Physician Assistant

## 2019-02-24 ENCOUNTER — Encounter: Payer: Self-pay | Admitting: Physician Assistant

## 2019-02-24 DIAGNOSIS — R059 Cough, unspecified: Secondary | ICD-10-CM

## 2019-02-24 DIAGNOSIS — R05 Cough: Secondary | ICD-10-CM

## 2019-02-24 MED ORDER — DICLOFENAC SODIUM 75 MG PO TBEC: 75.0000 mg | DELAYED_RELEASE_TABLET | Freq: Two times a day (BID) | ORAL | 0 refills | Status: DC

## 2019-02-24 NOTE — Telephone Encounter (Signed)
Please call patient and review that I would like for him to be tested at the screening site at the [facility] building because he needs documentation that he is safe to return to work. I have sent him the following MyChart message, please make sure he doesn't have any questions:  Because the situation is rapidly evolving in [facility] and we now have at least one confirmed case, I think it would be best to screen you for COVID-19 prior to sending you back to work.   I am going to put in an order for you to be screened, this can be done at [address].   There is a drive-up station collection site at that address and you can tell them that your provider has put an order for you to be tested.   I have reviewed this plan with Dr. Jimmey Ralph and he is in agreement to this.

## 2019-02-24 NOTE — Telephone Encounter (Signed)
Spoke to pt asked him if he saw his My Chart message from [place]? Pt said yes. Asked him if he had any questions? Pt said he just doesn't understanding because he has not had a fever. Told pt I am sorry but according to guidelines now this is what we have to do. Also you will have results back in 24-72 hours. Pt verbalized understanding.

## 2019-02-27 ENCOUNTER — Encounter: Payer: Self-pay | Admitting: Physician Assistant

## 2019-03-02 ENCOUNTER — Ambulatory Visit: Payer: Self-pay | Admitting: *Deleted

## 2019-03-02 NOTE — Telephone Encounter (Signed)
Pt called requesting results of the coronavirus. No results noted in chart. Pt informed that the results show be going into his MyChart.  Advised to give it a few more days and call the office back if no results. Pt voiced understanding.

## 2019-03-02 NOTE — Telephone Encounter (Signed)
See note

## 2019-03-03 LAB — NOVEL CORONAVIRUS, NAA: SARS-CoV-2, NAA: NOT DETECTED

## 2019-03-03 NOTE — Telephone Encounter (Signed)
Please call patient.  I know that he is currently awaiting results for COVID swab to return to work.  There are now new guidelines regarding this.  Please ask the following questions: 1. When was the last time patient had fever (>100.4)? 2. Are cough and other respiratory symptoms improved?  If the answer to number 1 is >3 days ago AND the answer to number 2 is yes, he may return to work.  If he needs documentation for this for work, please ask what he needs so we can provide this for him.  We will be in touch with his swab result as well.  Jarold Motto PA-C

## 2019-03-03 NOTE — Telephone Encounter (Signed)
Spoke to pt asked if he saw My Chart note about swab still pending? Pt said yes. Told pt Lelon Mast said she knows that you are currently awaiting results for COVID swab to return to work.  There are now new guidelines regarding this.  Please ask the following questions: 1. When was the last time patient had fever (>100.4)? Pt said he never had fever this whole time. 2. Are cough and other respiratory symptoms improved? Pt said he is 100% better, no symptoms.  Told pt okay you may return to work being as you are symptom free. Do you need documentation for your work? Pt said yes, he will need letter to return to work. Told pt okay do you want to pick up? Pt said no please put letter in My Chart so he can get it from there. Told pt okay and we will be in touch with his swab result as soon as we get it. Pt verbalized understanding.

## 2019-03-03 NOTE — Telephone Encounter (Signed)
Please see message pt does need letter for work.

## 2019-03-03 NOTE — Telephone Encounter (Signed)
Patient will be notified when results are available.

## 2019-04-19 ENCOUNTER — Other Ambulatory Visit: Payer: Self-pay | Admitting: Family Medicine

## 2019-05-14 ENCOUNTER — Other Ambulatory Visit: Payer: Self-pay | Admitting: Family Medicine

## 2019-05-14 NOTE — Telephone Encounter (Signed)
Last OV 02/20/19 Last refill 04/20/19 #60/0 Next OV not scheduled

## 2019-07-13 ENCOUNTER — Other Ambulatory Visit: Payer: Self-pay

## 2019-07-13 MED ORDER — LEVOTHYROXINE SODIUM 112 MCG PO TABS: 112.0000 ug | ORAL_TABLET | Freq: Every day | ORAL | 3 refills | Status: DC

## 2019-09-07 ENCOUNTER — Other Ambulatory Visit: Payer: Self-pay | Admitting: Family Medicine

## 2019-12-02 ENCOUNTER — Other Ambulatory Visit: Payer: Self-pay

## 2019-12-02 MED ORDER — DICLOFENAC SODIUM 75 MG PO TBEC: 75.0000 mg | DELAYED_RELEASE_TABLET | Freq: Two times a day (BID) | ORAL | 0 refills | Status: DC

## 2019-12-18 ENCOUNTER — Encounter: Payer: Self-pay | Admitting: Family Medicine

## 2019-12-21 ENCOUNTER — Ambulatory Visit: Payer: No Typology Code available for payment source | Attending: Internal Medicine

## 2020-10-04 ENCOUNTER — Other Ambulatory Visit: Payer: Self-pay | Admitting: Family Medicine

## 2020-10-20 ENCOUNTER — Other Ambulatory Visit: Payer: Self-pay

## 2020-10-20 ENCOUNTER — Encounter: Payer: Self-pay | Admitting: Family Medicine

## 2020-10-20 ENCOUNTER — Ambulatory Visit (INDEPENDENT_AMBULATORY_CARE_PROVIDER_SITE_OTHER): Payer: No Typology Code available for payment source | Admitting: Family Medicine

## 2020-10-20 VITALS — BP 107/64 | HR 60 | Temp 98.2°F | Ht 69.0 in | Wt 230.0 lb

## 2020-10-20 DIAGNOSIS — Z803 Family history of malignant neoplasm of breast: Secondary | ICD-10-CM | POA: Diagnosis not present

## 2020-10-20 DIAGNOSIS — Z6833 Body mass index (BMI) 33.0-33.9, adult: Secondary | ICD-10-CM

## 2020-10-20 DIAGNOSIS — E669 Obesity, unspecified: Secondary | ICD-10-CM

## 2020-10-20 DIAGNOSIS — Z0001 Encounter for general adult medical examination with abnormal findings: Secondary | ICD-10-CM | POA: Diagnosis not present

## 2020-10-20 DIAGNOSIS — Z13220 Encounter for screening for lipoid disorders: Secondary | ICD-10-CM

## 2020-10-20 DIAGNOSIS — R739 Hyperglycemia, unspecified: Secondary | ICD-10-CM

## 2020-10-20 DIAGNOSIS — L4050 Arthropathic psoriasis, unspecified: Secondary | ICD-10-CM

## 2020-10-20 DIAGNOSIS — E039 Hypothyroidism, unspecified: Secondary | ICD-10-CM | POA: Diagnosis not present

## 2020-10-20 NOTE — Assessment & Plan Note (Signed)
Check A1c.  Continue lifestyle modifications. 

## 2020-10-20 NOTE — Patient Instructions (Signed)
It was very nice to see you today!  I am glad that you are doing well.  We will check blood work today.  I will see back in year for your next physical.  Please come back to see me sooner if needed.  Take care, Dr Jerline Pain  Please try these tips to maintain a healthy lifestyle:   Eat at least 3 REAL meals and 1-2 snacks per day.  Aim for no more than 5 hours between eating.  If you eat breakfast, please do so within one hour of getting up.    Each meal should contain half fruits/vegetables, one quarter protein, and one quarter carbs (no bigger than a computer mouse)   Cut down on sweet beverages. This includes juice, soda, and sweet tea.     Drink at least 1 glass of water with each meal and aim for at least 8 glasses per day   Exercise at least 150 minutes every week.    Preventive Care 3-61 Years Old, Male Preventive care refers to lifestyle choices and visits with your health care provider that can promote health and wellness. This includes:  A yearly physical exam. This is also called an annual well check.  Regular dental and eye exams.  Immunizations.  Screening for certain conditions.  Healthy lifestyle choices, such as eating a healthy diet, getting regular exercise, not using drugs or products that contain nicotine and tobacco, and limiting alcohol use. What can I expect for my preventive care visit? Physical exam Your health care provider will check:  Height and weight. These may be used to calculate body mass index (BMI), which is a measurement that tells if you are at a healthy weight.  Heart rate and blood pressure.  Your skin for abnormal spots. Counseling Your health care provider may ask you questions about:  Alcohol, tobacco, and drug use.  Emotional well-being.  Home and relationship well-being.  Sexual activity.  Eating habits.  Work and work Statistician. What immunizations do I need?  Influenza (flu) vaccine  This is recommended  every year. Tetanus, diphtheria, and pertussis (Tdap) vaccine  You may need a Td booster every 10 years. Varicella (chickenpox) vaccine  You may need this vaccine if you have not already been vaccinated. Human papillomavirus (HPV) vaccine  If recommended by your health care provider, you may need three doses over 6 months. Measles, mumps, and rubella (MMR) vaccine  You may need at least one dose of MMR. You may also need a second dose. Meningococcal conjugate (MenACWY) vaccine  One dose is recommended if you are 81-60 years old and a Market researcher living in a residence hall, or if you have one of several medical conditions. You may also need additional booster doses. Pneumococcal conjugate (PCV13) vaccine  You may need this if you have certain conditions and were not previously vaccinated. Pneumococcal polysaccharide (PPSV23) vaccine  You may need one or two doses if you smoke cigarettes or if you have certain conditions. Hepatitis A vaccine  You may need this if you have certain conditions or if you travel or work in places where you may be exposed to hepatitis A. Hepatitis B vaccine  You may need this if you have certain conditions or if you travel or work in places where you may be exposed to hepatitis B. Haemophilus influenzae type b (Hib) vaccine  You may need this if you have certain risk factors. You may receive vaccines as individual doses or as more than one vaccine  together in one shot (combination vaccines). Talk with your health care provider about the risks and benefits of combination vaccines. What tests do I need? Blood tests  Lipid and cholesterol levels. These may be checked every 5 years starting at age 20.  Hepatitis C test.  Hepatitis B test. Screening   Diabetes screening. This is done by checking your blood sugar (glucose) after you have not eaten for a while (fasting).  Sexually transmitted disease (STD) testing. Talk with your health  care provider about your test results, treatment options, and if necessary, the need for more tests. Follow these instructions at home: Eating and drinking   Eat a diet that includes fresh fruits and vegetables, whole grains, lean protein, and low-fat dairy products.  Take vitamin and mineral supplements as recommended by your health care provider.  Do not drink alcohol if your health care provider tells you not to drink.  If you drink alcohol: ? Limit how much you have to 0-2 drinks a day. ? Be aware of how much alcohol is in your drink. In the [place] one drink equals one 12 oz bottle of beer (355 mL), one 5 oz glass of wine (148 mL), or one 1 oz glass of hard liquor (44 mL). Lifestyle  Take daily care of your teeth and gums.  Stay active. Exercise for at least 30 minutes on 5 or more days each week.  Do not use any products that contain nicotine or tobacco, such as cigarettes, e-cigarettes, and chewing tobacco. If you need help quitting, ask your health care provider.  If you are sexually active, practice safe sex. Use a condom or other form of protection to prevent STIs (sexually transmitted infections). What's next?  Go to your health care provider once a year for a well check visit.  Ask your health care provider how often you should have your eyes and teeth checked.  Stay up to date on all vaccines. This information is not intended to replace advice given to you by your health care provider. Make sure you discuss any questions you have with your health care provider. Document Revised: 11/20/2018 Document Reviewed: 11/20/2018 Elsevier Patient Education  2020 Elsevier Inc.  

## 2020-10-20 NOTE — Assessment & Plan Note (Signed)
Check TSH.  Continue levothyroxine 112 mcg daily. 

## 2020-10-20 NOTE — Assessment & Plan Note (Addendum)
Very strong family history.  No relative diagnosed at age 36.  Will place referral to oncology when he is 38.  Discussed routine self breast exams.

## 2020-10-20 NOTE — Assessment & Plan Note (Addendum)
Follows with rheumatology. Was on cosentix.  Check labs today.

## 2020-10-20 NOTE — Progress Notes (Signed)
Chief Complaint:  Theodore Mcdonald is a 34 y.o. male who presents today for his annual comprehensive physical exam.    Assessment/Plan:  Chronic Problems Addressed Today: Psoriatic arthritis (HCC) Follows with rheumatology. Was on cosentix.  Check labs today.  Hypothyroidism Check TSH. Continue levothyroxine daily.   Hyperglycemia Check A1c.  Continue lifestyle modifications.  Family history of breast cancer Very strong family history.  No relative diagnosed at age 54.  Will place referral to oncology when he is 38.  Discussed routine self breast exams.   Body mass index is 33.97 kg/m. / Obese  BMI Metric Follow Up - 10/20/20 1036      BMI Metric Follow Up-Please document annually   BMI Metric Follow Up Education provided            Preventative Healthcare: Check CBC, CMET, TSH, lipid panel.  Patient Counseling(The following topics were reviewed and/or handout was given):  -Nutrition: Stressed importance of moderation in sodium/caffeine intake, saturated fat and cholesterol, caloric balance, sufficient intake of fresh fruits, vegetables, and fiber.  -Stressed the importance of regular exercise.   -Substance Abuse: Discussed cessation/primary prevention of tobacco, alcohol, or other drug use; driving or other dangerous activities under the influence; availability of treatment for abuse.   -Injury prevention: Discussed safety belts, safety helmets, smoke detector, smoking near bedding or upholstery.   -Sexuality: Discussed sexually transmitted diseases, partner selection, use of condoms, avoidance of unintended pregnancy and contraceptive alternatives.   -Dental health: Discussed importance of regular tooth brushing, flossing, and dental visits.  -Health maintenance and immunizations reviewed. Please refer to Health maintenance section.  Return to care in 1 year for next preventative visit.     Subjective:  HPI:  He has no acute complaints today.    Lifestyle Diet: Balanced.  Working on portion control. Cut out sodas.  Exercise: Doing more exercise.   Depression screen [facility] 2/9 10/20/2020  Decreased Interest 0  Down, Depressed, Hopeless 0  PHQ - 2 Score 0  Altered sleeping 1  Tired, decreased energy 1  Change in appetite 0  Feeling bad or failure about yourself  0  Trouble concentrating 1  Moving slowly or fidgety/restless 0  Suicidal thoughts 0  PHQ-9 Score 3  Difficult doing work/chores Not difficult at all   Health Maintenance Due  Topic Date Due  . Hepatitis C Screening  Never done  . HIV Screening  Never done     ROS: Per HPI, otherwise a complete review of systems was negative.   PMH:  The following were reviewed and entered/updated in epic: Past Medical History:  Diagnosis Date  . Chicken pox   . Depression   . Thyroid disease    Patient Active Problem List   Diagnosis Date Noted  . Hyperglycemia 10/20/2020  . Family history of breast cancer 01/28/2018  . Hypothyroidism 01/28/2018  . Psoriatic arthritis (HCC) 01/28/2018   History reviewed. No pertinent surgical history.  Family History  Problem Relation Age of Onset  . Arthritis Mother   . Cancer Mother   . Depression Mother   . Diabetes Mother   . Arthritis Father   . Depression Father   . Mental illness Father   . Miscarriages / Stillbirths Sister   . Arthritis Maternal Grandmother   . Cancer Maternal Grandmother   . Diabetes Maternal Grandmother   . Heart disease Maternal Grandmother   . Cancer Maternal Grandfather   . Cancer Paternal Grandmother   . Diabetes Paternal Grandmother   .  Arthritis Paternal Grandfather   . Cancer Paternal Grandfather   . COPD Paternal Grandfather     Medications- reviewed and updated Current Outpatient Medications  Medication Sig Dispense Refill  . levothyroxine (SYNTHROID) 112 MCG tablet Take 1 tablet (112 mcg total) by mouth daily. 90 tablet 3   No current facility-administered medications for this  visit.    Allergies-reviewed and updated No Known Allergies  Social History   Socioeconomic History  . Marital status: Married    Spouse name: Not on file  . Number of children: 4  . Years of education: Not on file  . Highest education level: Not on file  Occupational History  . Not on file  Tobacco Use  . Smoking status: Never Smoker  . Smokeless tobacco: Never Used  Substance and Sexual Activity  . Alcohol use: Yes  . Drug use: No  . Sexual activity: Yes    Partners: Female  Other Topics Concern  . Not on file  Social History Narrative  . Not on file   Social Determinants of Health   Financial Resource Strain:   . Difficulty of Paying Living Expenses: Not on file  Food Insecurity:   . Worried About Programme researcher, broadcasting/film/video in the Last Year: Not on file  . Ran Out of Food in the Last Year: Not on file  Transportation Needs:   . Lack of Transportation (Medical): Not on file  . Lack of Transportation (Non-Medical): Not on file  Physical Activity:   . Days of Exercise per Week: Not on file  . Minutes of Exercise per Session: Not on file  Stress:   . Feeling of Stress : Not on file  Social Connections:   . Frequency of Communication with Friends and Family: Not on file  . Frequency of Social Gatherings with Friends and Family: Not on file  . Attends Religious Services: Not on file  . Active Member of Clubs or Organizations: Not on file  . Attends Banker Meetings: Not on file  . Marital Status: Not on file        Objective:  Physical Exam: BP 107/64   Pulse 60   Temp 98.2 F (36.8 C) (Temporal)   Ht 5\' 9"  (1.753 m)   Wt 230 lb (104.3 kg)   SpO2 99%   BMI 33.97 kg/m   Body mass index is 33.97 kg/m. Wt Readings from Last 3 Encounters:  10/20/20 230 lb (104.3 kg)  07/01/18 246 lb (111.6 kg)  01/28/18 237 lb 12.8 oz (107.9 kg)   Gen: NAD, resting comfortably HEENT: TMs normal bilaterally. OP clear. No thyromegaly noted.  CV: RRR with no  murmurs appreciated Pulm: NWOB, CTAB with no crackles, wheezes, or rhonchi GI: Normal bowel sounds present. Soft, Nontender, Nondistended. MSK: no edema, cyanosis, or clubbing noted Skin: warm, dry Neuro: CN2-12 grossly intact. Strength 5/5 in upper and lower extremities. Reflexes symmetric and intact bilaterally.  Psych: Normal affect and thought content     Lindamarie Maclachlan M. 01/30/18, MD 10/20/2020 10:38 AM

## 2020-10-21 LAB — HEMOGLOBIN A1C
Hgb A1c MFr Bld: 5.3 % of total Hgb (ref <5.7)
Mean Plasma Glucose: 105 (calc)
eAG (mmol/L): 5.8 (calc)

## 2020-10-21 LAB — LIPID PANEL
Cholesterol: 164 mg/dL (ref <200)
HDL: 51 mg/dL (ref >=40)
LDL Cholesterol (Calc): 90 mg/dL (calc)
Non-HDL Cholesterol (Calc): 113 mg/dL (calc) (ref <130)
Total CHOL/HDL Ratio: 3.2 (calc) (ref <5.0)
Triglycerides: 134 mg/dL (ref <150)

## 2020-10-21 LAB — COMPREHENSIVE METABOLIC PANEL
AG Ratio: 2 (calc) (ref 1.0–2.5)
ALT: 25 U/L (ref 9–46)
AST: 22 U/L (ref 10–40)
Albumin: 4.6 g/dL (ref 3.6–5.1)
Alkaline phosphatase (APISO): 49 U/L (ref 36–130)
BUN: 13 mg/dL (ref 7–25)
CO2: 30 mmol/L (ref 20–32)
Calcium: 9.9 mg/dL (ref 8.6–10.3)
Chloride: 103 mmol/L (ref 98–110)
Creat: 0.92 mg/dL (ref 0.60–1.35)
Globulin: 2.3 g/dL (calc) (ref 1.9–3.7)
Glucose, Bld: 89 mg/dL (ref 65–99)
Potassium: 4.2 mmol/L (ref 3.5–5.3)
Sodium: 139 mmol/L (ref 135–146)
Total Bilirubin: 0.8 mg/dL (ref 0.2–1.2)
Total Protein: 6.9 g/dL (ref 6.1–8.1)

## 2020-10-21 LAB — CBC
HCT: 42.8 % (ref 38.5–50.0)
Hemoglobin: 14.7 g/dL (ref 13.2–17.1)
MCH: 31 pg (ref 27.0–33.0)
MCHC: 34.3 g/dL (ref 32.0–36.0)
MCV: 90.3 fL (ref 80.0–100.0)
MPV: 10.2 fL (ref 7.5–12.5)
Platelets: 252 10*3/uL (ref 140–400)
RBC: 4.74 10*6/uL (ref 4.20–5.80)
RDW: 12.8 % (ref 11.0–15.0)
WBC: 5.8 10*3/uL (ref 3.8–10.8)

## 2020-10-21 LAB — TSH: TSH: 3.32 mIU/L (ref 0.40–4.50)

## 2020-10-21 NOTE — Progress Notes (Signed)
Please inform patient of the following:  Blood work all NORMAL. Would like for him to continue with current treatment plan and we can recheck again in year or so.  Katina Degree. Jimmey Ralph, MD 10/21/2020 7:56 AM

## 2021-01-02 LAB — NOVEL CORONAVIRUS, NAA: SARS-CoV-2, NAA: NEGATIVE

## 2021-01-09 ENCOUNTER — Encounter: Payer: Self-pay | Admitting: Family Medicine

## 2021-03-12 ENCOUNTER — Other Ambulatory Visit: Payer: Self-pay | Admitting: Family Medicine

## 2021-04-07 ENCOUNTER — Encounter: Payer: Self-pay | Admitting: Family Medicine

## 2021-06-28 ENCOUNTER — Other Ambulatory Visit: Payer: Self-pay | Admitting: Family Medicine

## 2021-07-03 LAB — CBC AND DIFFERENTIAL
HCT: 42 (ref 41–53)
Hemoglobin: 14.1 (ref 13.5–17.5)
Neutrophils Absolute: 4.4
Platelets: 281 (ref 150–399)
WBC: 6.7

## 2021-07-03 LAB — HEPATIC FUNCTION PANEL
ALT: 23 (ref 10–40)
AST: 30 (ref 14–40)
Alkaline Phosphatase: 52 (ref 25–125)
Bilirubin, Total: 1.1

## 2021-07-03 LAB — BASIC METABOLIC PANEL
BUN: 20 (ref 4–21)
CO2: 32 — AB (ref 13–22)
Chloride: 99 (ref 99–108)
Creatinine: 1 (ref 0.6–1.3)
Glucose: 85
Potassium: 4.2 (ref 3.4–5.3)
Sodium: 139 (ref 137–147)

## 2021-07-03 LAB — COMPREHENSIVE METABOLIC PANEL
Albumin: 5.1 — AB (ref 3.5–5.0)
Calcium: 10.6 (ref 8.7–10.7)
GFR calc non Af Amer: 95

## 2021-07-03 LAB — CBC: RBC: 4.61 (ref 3.87–5.11)

## 2021-07-20 ENCOUNTER — Encounter: Payer: Self-pay | Admitting: Family Medicine

## 2021-11-05 ENCOUNTER — Other Ambulatory Visit: Payer: Self-pay | Admitting: Family Medicine

## 2022-05-31 ENCOUNTER — Other Ambulatory Visit: Payer: Self-pay | Admitting: Family Medicine

## 2022-05-31 ENCOUNTER — Encounter: Payer: Self-pay | Admitting: Family Medicine

## 2022-06-01 ENCOUNTER — Other Ambulatory Visit: Payer: Self-pay

## 2022-06-01 MED ORDER — LEVOTHYROXINE SODIUM 112 MCG PO TABS: 112.0000 ug | ORAL_TABLET | Freq: Every day | ORAL | 0 refills | Status: DC

## 2022-06-22 ENCOUNTER — Encounter: Payer: Self-pay | Admitting: Family Medicine

## 2022-06-22 ENCOUNTER — Ambulatory Visit (INDEPENDENT_AMBULATORY_CARE_PROVIDER_SITE_OTHER): Payer: No Typology Code available for payment source | Admitting: Family Medicine

## 2022-06-22 VITALS — BP 120/76 | HR 63 | Temp 97.9°F | Ht 69.0 in | Wt 231.8 lb

## 2022-06-22 DIAGNOSIS — Z0001 Encounter for general adult medical examination with abnormal findings: Secondary | ICD-10-CM

## 2022-06-22 DIAGNOSIS — R739 Hyperglycemia, unspecified: Secondary | ICD-10-CM

## 2022-06-22 DIAGNOSIS — Z13220 Encounter for screening for lipoid disorders: Secondary | ICD-10-CM

## 2022-06-22 DIAGNOSIS — L4050 Arthropathic psoriasis, unspecified: Secondary | ICD-10-CM | POA: Diagnosis not present

## 2022-06-22 DIAGNOSIS — E039 Hypothyroidism, unspecified: Secondary | ICD-10-CM

## 2022-06-22 DIAGNOSIS — Z114 Encounter for screening for human immunodeficiency virus [HIV]: Secondary | ICD-10-CM

## 2022-06-22 DIAGNOSIS — Z1159 Encounter for screening for other viral diseases: Secondary | ICD-10-CM

## 2022-06-22 MED ORDER — LEVOTHYROXINE SODIUM 112 MCG PO TABS: 112.0000 ug | ORAL_TABLET | Freq: Every day | ORAL | 3 refills | Status: DC

## 2022-06-22 NOTE — Assessment & Plan Note (Signed)
Follows with rheumatology.  Currently on Stelara though may be switching to alternative medication.

## 2022-06-22 NOTE — Patient Instructions (Signed)
It was very nice to see you today!  We will check blood work today.  I will refill your thyroid medication.  We will see you back in year.  Please come back to see me sooner if needed.  Take care, Dr Jimmey Ralph  PLEASE NOTE:  If you had any lab tests please let us know if you have not heard back within a few days. You may see your results on mychart before we have a chance to review them but we will give you a call once they are reviewed by [place]. If we ordered any referrals today, please let us know if you have not heard from their office within the next week.   Please try these tips to maintain a healthy lifestyle:  Eat at least 3 REAL meals and 1-2 snacks per day.  Aim for no more than 5 hours between eating.  If you eat breakfast, please do so within one hour of getting up.   Each meal should contain half fruits/vegetables, one quarter protein, and one quarter carbs (no bigger than a computer mouse)  Cut down on sweet beverages. This includes juice, soda, and sweet tea.   Drink at least 1 glass of water with each meal and aim for at least 8 glasses per day  Exercise at least 150 minutes every week.    Preventive Care 66-80 Years Old, Male Preventive care refers to lifestyle choices and visits with your health care provider that can promote health and wellness. Preventive care visits are also called wellness exams. What can I expect for my preventive care visit? Counseling During your preventive care visit, your health care provider may ask about your: Medical history, including: Past medical problems. Family medical history. Current health, including: Emotional well-being. Home life and relationship well-being. Sexual activity. Lifestyle, including: Alcohol, nicotine or tobacco, and drug use. Access to firearms. Diet, exercise, and sleep habits. Safety issues such as seatbelt and bike helmet use. Sunscreen use. Work and work Astronomer. Physical exam Your health care  provider may check your: Height and weight. These may be used to calculate your BMI (body mass index). BMI is a measurement that tells if you are at a healthy weight. Waist circumference. This measures the distance around your waistline. This measurement also tells if you are at a healthy weight and may help predict your risk of certain diseases, such as type 2 diabetes and high blood pressure. Heart rate and blood pressure. Body temperature.  Vaccines are usually given at various ages, according to a schedule. Your health care provider will recommend vaccines for you based on your age, medical history, and lifestyle or other factors, such as travel or where you work. What tests do I need? Screening Your health care provider may recommend screening tests for certain conditions. This may include: Lipid and cholesterol levels. Diabetes screening. This is done by checking your blood sugar (glucose) after you have not eaten for a while (fasting). Hepatitis B test. Hepatitis C test. HIV (human immunodeficiency virus) test. STI (sexually transmitted infection) testing, if you are at risk. Talk with your health care provider about your test results, treatment options, and if necessary, the need for more tests. Follow these instructions at home: Eating and drinking  Eat a healthy diet that includes fresh fruits and vegetables, whole grains, lean protein, and low-fat dairy products. Drink enough fluid to keep your urine pale yellow. Take vitamin and mineral supplements as recommended by your health care provider. Do not drink alcohol if  your health care provider tells you not to drink. If you drink alcohol: Limit how much you have to 0-2 drinks a day. Know how much alcohol is in your drink. In the [place] one drink equals one 12 oz bottle of beer (355 mL), one 5 oz glass of wine (148 mL), or one 1 oz glass of hard liquor (44 mL). Lifestyle Brush your teeth every morning and night with fluoride  toothpaste. Floss one time each day. Exercise for at least 30 minutes 5 or more days each week. Do not use any products that contain nicotine or tobacco. These products include cigarettes, chewing tobacco, and vaping devices, such as e-cigarettes. If you need help quitting, ask your health care provider. Do not use drugs. If you are sexually active, practice safe sex. Use a condom or other form of protection to prevent STIs. Find healthy ways to manage stress, such as: Meditation, yoga, or listening to music. Journaling. Talking to a trusted person. Spending time with friends and family. Minimize exposure to UV radiation to reduce your risk of skin cancer. Safety Always wear your seat belt while driving or riding in a vehicle. Do not drive: If you have been drinking alcohol. Do not ride with someone who has been drinking. If you have been using any mind-altering substances or drugs. While texting. When you are tired or distracted. Wear a helmet and other protective equipment during sports activities. If you have firearms in your house, make sure you follow all gun safety procedures. Seek help if you have been physically or sexually abused. What's next? Go to your health care provider once a year for an annual wellness visit. Ask your health care provider how often you should have your eyes and teeth checked. Stay up to date on all vaccines. This information is not intended to replace advice given to you by your health care provider. Make sure you discuss any questions you have with your health care provider. Document Revised: 05/24/2021 Document Reviewed: 05/24/2021 Elsevier Patient Education  2023 [employer].

## 2022-06-22 NOTE — Assessment & Plan Note (Signed)
On Synthroid 112 mcg daily.  We will check TSH today.  We will refill medications.

## 2022-06-22 NOTE — Assessment & Plan Note (Signed)
Check A1c.  He is setting regimen with diet and exercise.

## 2022-06-22 NOTE — Progress Notes (Signed)
Chief Complaint:  Theodore Mcdonald is a 36 y.o. male who presents today for his annual comprehensive physical exam.    Assessment/Plan:  Chronic Problems Addressed Today: Hyperglycemia Check A1c.  He is setting regimen with diet and exercise.  Psoriatic arthritis (HCC) Follows with rheumatology.  Currently on Stelara though may be switching to alternative medication.  Hypothyroidism On Synthroid 112 mcg daily.  We will check TSH today.  We will refill medications.  Preventative Healthcare: Check labs.   Patient Counseling(The following topics were reviewed and/or handout was given):  -Nutrition: Stressed importance of moderation in sodium/caffeine intake, saturated fat and cholesterol, caloric balance, sufficient intake of fresh fruits, vegetables, and fiber.  -Stressed the importance of regular exercise.   -Substance Abuse: Discussed cessation/primary prevention of tobacco, alcohol, or other drug use; driving or other dangerous activities under the influence; availability of treatment for abuse.   -Injury prevention: Discussed safety belts, safety helmets, smoke detector, smoking near bedding or upholstery.   -Sexuality: Discussed sexually transmitted diseases, partner selection, use of condoms, avoidance of unintended pregnancy and contraceptive alternatives.   -Dental health: Discussed importance of regular tooth brushing, flossing, and dental visits.  -Health maintenance and immunizations reviewed. Please refer to Health maintenance section.  Return to care in 1 year for next preventative visit.     Subjective:  HPI:  He has no acute complaints today.   Lifestyle Diet: Balanced. Plenty of fruits and vegetables. Intermitent fasting.  Exercise: runs ultramarathons     06/22/2022    2:12 PM  Depression screen PHQ 2/9  Decreased Interest 0  Down, Depressed, Hopeless 0  PHQ - 2 Score 0    Health Maintenance Due  Topic Date Due   Hepatitis C Screening  Never done      ROS: Per HPI, otherwise a complete review of systems was negative.   PMH:  The following were reviewed and entered/updated in epic: Past Medical History:  Diagnosis Date   Chicken pox    Depression    Thyroid disease    Patient Active Problem List   Diagnosis Date Noted   Hyperglycemia 10/20/2020   Family history of breast cancer 01/28/2018   Hypothyroidism 01/28/2018   Psoriatic arthritis (HCC) 01/28/2018   History reviewed. No pertinent surgical history.  Family History  Problem Relation Age of Onset   Arthritis Mother    Cancer Mother    Depression Mother    Diabetes Mother    Arthritis Father    Depression Father    Mental illness Father    Miscarriages / [place] Sister    Arthritis Maternal Grandmother    Cancer Maternal Grandmother    Diabetes Maternal Grandmother    Heart disease Maternal Grandmother    Cancer Maternal Grandfather    Cancer Paternal Grandmother    Diabetes Paternal Grandmother    Arthritis Paternal Grandfather    Cancer Paternal Grandfather    COPD Paternal Grandfather     Medications- reviewed and updated Current Outpatient Medications  Medication Sig Dispense Refill   STELARA 90 MG/ML SOSY injection Inject into the skin.     levothyroxine (SYNTHROID) 112 MCG tablet Take 1 tablet (112 mcg total) by mouth daily. 90 tablet 3   No current facility-administered medications for this visit.    Allergies-reviewed and updated No Known Allergies  Social History   Socioeconomic History   Marital status: Married    Spouse name: Not on file   Number of children: 4   Years of education: Not  on file   Highest education level: Not on file  Occupational History   Not on file  Tobacco Use   Smoking status: Never   Smokeless tobacco: Never  Substance and Sexual Activity   Alcohol use: Yes   Drug use: No   Sexual activity: Yes    Partners: Female  Other Topics Concern   Not on file  Social History Narrative   Not on file    Social Determinants of Health   Financial Resource Strain: Not on file  Food Insecurity: Not on file  Transportation Needs: Not on file  Physical Activity: Not on file  Stress: Not on file  Social Connections: Not on file        Objective:  Physical Exam: BP 120/76   Pulse 63   Temp 97.9 F (36.6 C)   Ht 5\' 9"  (1.753 m)   Wt 231 lb 12.8 oz (105.1 kg)   SpO2 97%   BMI 34.23 kg/m   Body mass index is 34.23 kg/m. Wt Readings from Last 3 Encounters:  06/22/22 231 lb 12.8 oz (105.1 kg)  10/20/20 230 lb (104.3 kg)  07/01/18 246 lb (111.6 kg)   Gen: NAD, resting comfortably HEENT: TMs normal bilaterally. OP clear. No thyromegaly noted.  CV: RRR with no murmurs appreciated Pulm: NWOB, CTAB with no crackles, wheezes, or rhonchi GI: Normal bowel sounds present. Soft, Nontender, Nondistended. MSK: no edema, cyanosis, or clubbing noted Skin: warm, dry Neuro: CN2-12 grossly intact. Strength 5/5 in upper and lower extremities. Reflexes symmetric and intact bilaterally.  Psych: Normal affect and thought content     Theodore Saldierna M. 07/03/18, MD 06/22/2022 2:41 PM

## 2022-06-25 LAB — COMPREHENSIVE METABOLIC PANEL
AG Ratio: 2 (calc) (ref 1.0–2.5)
ALT: 26 U/L (ref 9–46)
AST: 28 U/L (ref 10–40)
Albumin: 4.7 g/dL (ref 3.6–5.1)
Alkaline phosphatase (APISO): 44 U/L (ref 36–130)
BUN: 13 mg/dL (ref 7–25)
CO2: 28 mmol/L (ref 20–32)
Calcium: 9.9 mg/dL (ref 8.6–10.3)
Chloride: 101 mmol/L (ref 98–110)
Creat: 1.2 mg/dL (ref 0.60–1.26)
Globulin: 2.3 g/dL (calc) (ref 1.9–3.7)
Glucose, Bld: 83 mg/dL (ref 65–99)
Potassium: 3.9 mmol/L (ref 3.5–5.3)
Sodium: 138 mmol/L (ref 135–146)
Total Bilirubin: 1 mg/dL (ref 0.2–1.2)
Total Protein: 7 g/dL (ref 6.1–8.1)

## 2022-06-25 LAB — TSH: TSH: 2.78 mIU/L (ref 0.40–4.50)

## 2022-06-25 LAB — CBC WITH DIFFERENTIAL/PLATELET
Absolute Monocytes: 663 cells/uL (ref 200–950)
Basophils Absolute: 37 cells/uL (ref 0–200)
Basophils Relative: 0.6 %
Eosinophils Absolute: 62 cells/uL (ref 15–500)
Eosinophils Relative: 1 %
HCT: 41.9 % (ref 38.5–50.0)
Hemoglobin: 14.4 g/dL (ref 13.2–17.1)
Lymphs Abs: 1531 cells/uL (ref 850–3900)
MCH: 31.2 pg (ref 27.0–33.0)
MCHC: 34.4 g/dL (ref 32.0–36.0)
MCV: 90.7 fL (ref 80.0–100.0)
MPV: 10.6 fL (ref 7.5–12.5)
Monocytes Relative: 10.7 %
Neutro Abs: 3906 cells/uL (ref 1500–7800)
Neutrophils Relative %: 63 %
Platelets: 247 10*3/uL (ref 140–400)
RBC: 4.62 10*6/uL (ref 4.20–5.80)
RDW: 12.8 % (ref 11.0–15.0)
Total Lymphocyte: 24.7 %
WBC: 6.2 10*3/uL (ref 3.8–10.8)

## 2022-06-25 LAB — HEMOGLOBIN A1C
Hgb A1c MFr Bld: 5 % of total Hgb (ref <5.7)
Mean Plasma Glucose: 97 mg/dL
eAG (mmol/L): 5.4 mmol/L

## 2022-06-25 LAB — LIPID PANEL
Cholesterol: 194 mg/dL (ref <200)
HDL: 57 mg/dL (ref >=40)
LDL Cholesterol (Calc): 110 mg/dL (calc) — ABNORMAL HIGH
Non-HDL Cholesterol (Calc): 137 mg/dL (calc) — ABNORMAL HIGH (ref <130)
Total CHOL/HDL Ratio: 3.4 (calc) (ref <5.0)
Triglycerides: 156 mg/dL — ABNORMAL HIGH (ref <150)

## 2022-06-25 LAB — HEPATITIS C ANTIBODY: Hepatitis C Ab: NONREACTIVE

## 2022-06-25 LAB — HIV ANTIBODY (ROUTINE TESTING W REFLEX): HIV 1&2 Ab, 4th Generation: NONREACTIVE

## 2022-06-25 NOTE — Progress Notes (Signed)
Please inform patient of the following:  His cholesterol levels are elevated since last year.  Thing else is stable.  We can recheck in a year.  He should continue to work on diet and exercise.

## 2022-09-03 ENCOUNTER — Encounter: Payer: Self-pay | Admitting: *Deleted

## 2022-09-12 ENCOUNTER — Ambulatory Visit (INDEPENDENT_AMBULATORY_CARE_PROVIDER_SITE_OTHER): Payer: No Typology Code available for payment source | Admitting: Family Medicine

## 2022-09-12 ENCOUNTER — Encounter: Payer: Self-pay | Admitting: Family Medicine

## 2022-09-12 VITALS — BP 112/74 | HR 58 | Temp 97.7°F | Ht 69.0 in | Wt 223.4 lb

## 2022-09-12 DIAGNOSIS — L4050 Arthropathic psoriasis, unspecified: Secondary | ICD-10-CM | POA: Diagnosis not present

## 2022-09-12 DIAGNOSIS — R1030 Lower abdominal pain, unspecified: Secondary | ICD-10-CM

## 2022-09-12 DIAGNOSIS — E039 Hypothyroidism, unspecified: Secondary | ICD-10-CM | POA: Diagnosis not present

## 2022-09-12 MED ORDER — DICLOFENAC SODIUM 75 MG PO TBEC: 75.0000 mg | DELAYED_RELEASE_TABLET | Freq: Two times a day (BID) | ORAL | 0 refills | Status: DC

## 2022-09-12 NOTE — Progress Notes (Signed)
   Theodore Mcdonald is a 36 y.o. male who presents today for an office visit.  Assessment/Plan:  New/Acute Problems: Groin Pain History and exam more consistent with sports hernia and possible hip flexor strain however he does have slight bulge with Valsalva in bilateral inguinal canals on exam as well.  We will check ultrasound to further evaluate.  If ultrasound confirms inguinal hernia will need referral to surgery for further management.  We will treat concern for underlying sports hernia or hip flexor strain with course of diclofenac.  Also discussed home exercises.  If his ultrasound for inguinal hernia is negative and symptoms persist would consider referral to PT.  Chronic Problems Addressed Today: Psoriatic arthritis ([place]) Follows with rheumatology.  Symptoms of been stable.  He has tolerated diclofenac well in the past.  He will continue Stelara per rheumatology.  Hypothyroidism Stable on Synthroid 112 mcg daily.     Subjective:  HPI:  Patient here with groin pain. Started a couple of weeks ago after sneezing. Worse with certain motions, coughing and sneezing. No nausea or vomiting. Symptoms stable the last few weeks. Worse when going up a hill.  No other specific treatments tried.  Pain has made it hard for him to work and do activities around the house.       Objective:  Physical Exam: BP 112/74   Pulse (!) 58   Temp 97.7 F (36.5 C) (Temporal)   Ht 5\' 9"  (1.753 m)   Wt 223 lb 6.4 oz (101.3 kg)   SpO2 98%   BMI 32.99 kg/m   Gen: No acute distress, resting comfortably CV: Regular rate and rhythm with no murmurs appreciated Pulm: Normal work of breathing, clear to auscultation bilaterally with no crackles, wheezes, or rhonchi GI: Soft, nontender, nondistended MSK: Pain elicited with palpation of suprapubic area and resisted hip flexion bilaterally. GU: Normal male genitalia.  Slight bulge noted with Valsalva in bilateral inguinal canals left greater than  right. Neuro: Grossly normal, moves all extremities Psych: Normal affect and thought content      Abbigayle Toole M. Jerline Pain, MD 09/12/2022 7:54 AM

## 2022-09-12 NOTE — Assessment & Plan Note (Signed)
Follows with rheumatology.  Symptoms of been stable.  He has tolerated diclofenac well in the past.  He will continue Stelara per rheumatology.

## 2022-09-12 NOTE — Assessment & Plan Note (Signed)
Stable on Synthroid 112 mcg daily.

## 2022-09-12 NOTE — Patient Instructions (Signed)
It was very nice to see you today!  You may have a sports hernia. Please take the diclofenac twice daily and work on the exercises.  We will check an ultrasound to make sure that you did not have any true hernias.  We may need to send you to see a surgeon depending on the result.  Take care, Dr Jerline Pain  PLEASE NOTE:  If you had any lab tests please let us know if you have not heard back within a few days. You may see your results on mychart before we have a chance to review them but we will give you a call once they are reviewed by [place]. If we ordered any referrals today, please let us know if you have not heard from their office within the next week.   Please try these tips to maintain a healthy lifestyle:  Eat at least 3 REAL meals and 1-2 snacks per day.  Aim for no more than 5 hours between eating.  If you eat breakfast, please do so within one hour of getting up.   Each meal should contain half fruits/vegetables, one quarter protein, and one quarter carbs (no bigger than a computer mouse)  Cut down on sweet beverages. This includes juice, soda, and sweet tea.   Drink at least 1 glass of water with each meal and aim for at least 8 glasses per day  Exercise at least 150 minutes every week.    Sports Hernia Rehab Ask your health care provider which exercises are safe for you. Do exercises exactly as told by your health care provider and adjust them as directed. It is normal to feel mild stretching, pulling, tightness, or discomfort as you do these exercises. Stop right away if you feel sudden pain or your pain gets worse. Do not begin these exercises until told by your health care provider. Stretching and range-of-motion exercise This exercise warms up your muscles and joints and improves the movement and flexibility around your hip and pelvis. Seated stretch This exercise is sometimes called hamstrings and adductors stretch. Sit on the floor with your legs stretched wide. Keep your  knees straight during this exercise. Keeping your head and back in a straight line, bend at your waist to reach for your left foot (position A). You should feel a stretch in your right inner thigh (adductors). Hold this position for __________ seconds. Then slowly return to the upright position. Keeping your head and back in a straight line, bend at your waist to reach forward (position B). You should feel a stretch behind both of your thighs or knees (hamstrings). Hold this position for __________ seconds. Then slowly return to the upright position. Keeping your head and back in a straight line, bend at your waist to reach for your right foot (position C). You should feel a stretch in your left inner thigh (adductors). Hold this position for __________ seconds. Then slowly return to the upright position. Repeat __________ times. Complete this exercise __________ times a day. Strengthening exercises These exercises build strength around your hip and pelvis. Hip adduction, isometric This exercise is sometimes called inner thigh squeeze. Sit on a firm chair that positions your knees at about the same height as your hips. Place a large ball, firm pillow, or rolled-up bath towel between your thighs. Squeeze your thighs together without moving your legs (isometric adduction), gradually building tension. Hold this position for __________ seconds. Let your muscles relax completely before you repeat this exercise. Repeat __________ times. Complete  this exercise __________ times a day. Hip abduction, isometric Sit on a firm chair. Your knees should be at about the same height as your hips. Place one of your hands on the outside of each of your thighs, just above your knees. Push your thighs away from each other, against your hands, so that little movement takes place (isometric abduction). Gradually build muscle tension. Hold this position for __________ seconds. Let your muscles relax completely  before you repeat this exercise. Repeat __________ times. Complete this exercise __________ times a day. Pelvic tilt This exercise strengthens the muscles that lie deep in the abdomen. Lie on your back on a firm bed or the floor with your legs extended. Bend your knees so they are pointing toward the ceiling and your feet are flat on the floor. Tighten your lower abdominal muscles to press your lower back against the floor. This motion will tilt your pelvis so your tailbone points up toward the ceiling instead of pointing to your feet or the floor. To help with this exercise, you may place a small towel under your lower back and try to push your back into the towel. Hold this position for __________ seconds. Let your muscles relax completely before you repeat this exercise. Repeat __________ times. Complete this exercise __________ times a day. Hip adduction, isotonic This exercise is sometimes called side-lying straight leg raises. Lie on your side so your head, shoulder, hip, and knee are in a straight line with each other. To help maintain your balance, you may put the foot of your top leg in front of the leg that is on the floor. Your left / right leg should be on the bottom. Roll your hips slightly forward so your hips are stacked directly over each other and your left / right knee is facing forward. Tense the muscles of your inner thigh and lift your bottom leg 4-6 inches (10-15 cm). Do not roll your body forward or backward. Hold this position for __________ seconds. Slowly return to the starting position. Repeat __________ times. Complete this exercise __________ times a day. This information is not intended to replace advice given to you by your health care provider. Make sure you discuss any questions you have with your health care provider. Document Revised: 03/03/2021 Document Reviewed: 03/03/2021 Elsevier Patient Education  2023 [employer].

## 2022-09-13 ENCOUNTER — Ambulatory Visit
Admission: RE | Admit: 2022-09-13 | Discharge: 2022-09-13 | Disposition: A | Discharge status: Home / Self Care | Payer: No Typology Code available for payment source | Source: Ambulatory Visit | Attending: Family Medicine | Admitting: Family Medicine

## 2022-09-13 DIAGNOSIS — R1030 Lower abdominal pain, unspecified: Secondary | ICD-10-CM

## 2022-09-17 NOTE — Progress Notes (Signed)
Please inform patient of the following:  Ultrasound did not show any hernia but did so a few mildy prominent lymph nodes.  These are a marker of inflammation.  Would like for him to let us know if his symptoms are not improving and we can refer him to PT.  Theodore Mcdonald. Jerline Pain, MD 09/17/2022 12:57 PM

## 2023-01-02 LAB — HEPATIC FUNCTION PANEL
ALT: 28 U/L (ref 10–40)
AST: 29 (ref 14–40)
Alkaline Phosphatase: 55 (ref 25–125)
Bilirubin, Total: 0.9

## 2023-01-02 LAB — BASIC METABOLIC PANEL
BUN: 19 (ref 4–21)
CO2: 30 — AB (ref 13–22)
Chloride: 101 (ref 99–108)
Creatinine: 1.3 (ref 0.6–1.3)
Glucose: 96
Potassium: 4.5 mEq/L (ref 3.5–5.1)
Sodium: 139 (ref 137–147)

## 2023-01-02 LAB — COMPREHENSIVE METABOLIC PANEL
Albumin: 4.2 (ref 3.5–5.0)
Calcium: 9.4 (ref 8.7–10.7)
Globulin: 3.1
eGFR: 72

## 2023-01-02 LAB — CBC AND DIFFERENTIAL
HCT: 43 (ref 41–53)
Hemoglobin: 14.7 (ref 13.5–17.5)
Neutrophils Absolute: 61.2
Platelets: 286 10*3/uL (ref 150–400)
WBC: 5.5

## 2023-01-02 LAB — CBC: RBC: 4.81 (ref 3.87–5.11)

## 2023-01-03 LAB — LAB REPORT - SCANNED: EGFR (Non-African Amer.): 72

## 2023-01-23 ENCOUNTER — Encounter: Payer: Self-pay | Admitting: Family Medicine

## 2023-07-10 ENCOUNTER — Other Ambulatory Visit: Payer: Self-pay | Admitting: Family Medicine

## 2024-01-23 ENCOUNTER — Other Ambulatory Visit: Payer: Self-pay | Admitting: Family Medicine

## 2024-02-09 ENCOUNTER — Ambulatory Visit (HOSPITAL_COMMUNITY): Payer: Self-pay

## 2024-05-25 ENCOUNTER — Telehealth: Payer: Self-pay | Admitting: Family Medicine

## 2024-05-25 NOTE — Telephone Encounter (Signed)
 LVM to come in for med refills & mass on rib. Leaving CPE as scheduled.

## 2024-05-29 ENCOUNTER — Encounter: Payer: Self-pay | Admitting: Family Medicine

## 2024-05-29 ENCOUNTER — Ambulatory Visit: Payer: Self-pay | Admitting: Family Medicine

## 2024-05-29 VITALS — BP 102/66 | HR 51 | Temp 97.2°F | Ht 69.0 in | Wt 217.8 lb

## 2024-05-29 DIAGNOSIS — D179 Benign lipomatous neoplasm, unspecified: Secondary | ICD-10-CM | POA: Insufficient documentation

## 2024-05-29 DIAGNOSIS — E785 Hyperlipidemia, unspecified: Secondary | ICD-10-CM | POA: Insufficient documentation

## 2024-05-29 DIAGNOSIS — L4050 Arthropathic psoriasis, unspecified: Secondary | ICD-10-CM | POA: Diagnosis not present

## 2024-05-29 DIAGNOSIS — E039 Hypothyroidism, unspecified: Secondary | ICD-10-CM | POA: Diagnosis not present

## 2024-05-29 MED ORDER — LEVOTHYROXINE SODIUM 112 MCG PO TABS: 112.0000 ug | ORAL_TABLET | Freq: Every day | ORAL | 3 refills | Status: AC

## 2024-05-29 NOTE — Assessment & Plan Note (Signed)
Continue management per rheumatology. 

## 2024-05-29 NOTE — Progress Notes (Signed)
   Theodore Mcdonald is a 38 y.o. male who presents today for an office visit.  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  Lesion on the right rib all consistent with lipoma.  We did discuss referral to surgery versus watchful waiting.  He is not interested in further management at this point.  Will continue with watchful waiting.  We discussed reasons to return to care.  Hypothyroidism On Synthroid  112 mcg daily.  Will refill today.  He will be coming back in a couple months for CPE and we can check lipids at that time.  Psoriatic arthritis ([facility]) Continue management per rheumatology.  Dyslipidemia Check lipids with next blood draw.     Subjective:  HPI:  See Assessment / plan for status of chronic conditions.    He noticed a lump on his right rib for the last month or so. No obvious precipitating events.  No pain.  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  Has a family history of lipoma.  No specific treatments tried.  Symptoms have been stable the last month or so.       Objective:  Physical Exam: BP 102/66   Pulse (!) 51   Temp (!) 97.2 F (36.2 C) (Temporal)   Ht 5' 9 (1.753 m)   Wt 217 lb 12.8 oz (98.8 kg)   SpO2 100%   BMI 32.16 kg/m   Wt Readings from Last 3 Encounters:  05/29/24 217 lb 12.8 oz (98.8 kg)  09/12/22 223 lb 6.4 oz (101.3 kg)  06/22/22 231 lb 12.8 oz (105.1 kg)    Gen: No acute distress, resting comfortably CV: Regular rate and rhythm with no murmurs appreciated Pulm: Normal work of breathing, clear to auscultation bilaterally with no crackles, wheezes, or rhonchi Skin: Freely mobile approximately 1 cm mass on right flank over lateral rib wall Neuro: Grossly normal, moves all extremities Psych: Normal affect and thought content      Christion Leonhard M. Daneil Dunker, MD 05/29/2024 8:17 AM

## 2024-05-29 NOTE — Patient Instructions (Signed)
 It was very nice to see you today!  You have a lipoma. This is benign.  Let us  know if it worsens or you want to have a surgery to remove this.  I will refill your thyroid  medication today.  Please come back soon for labs.  Return for Annual Physical.   Take care, Dr Daneil Dunker  PLEASE NOTE:  If you had any lab tests, please let us  know if you have not heard back within a few days. You may see your results on mychart before we have a chance to review them but we will give you a call once they are reviewed by us .   If we ordered any referrals today, please let us  know if you have not heard from their office within the next week.   If you had any urgent prescriptions sent in today, please check with the pharmacy within an hour of our visit to make sure the prescription was transmitted appropriately.   Please try these tips to maintain a healthy lifestyle:  Eat at least 3 REAL meals and 1-2 snacks per day.  Aim for no more than 5 hours between eating.  If you eat breakfast, please do so within one hour of getting up.   Each meal should contain half fruits/vegetables, one quarter protein, and one quarter carbs (no bigger than a computer mouse)  Cut down on sweet beverages. This includes juice, soda, and sweet tea.   Drink at least 1 glass of water with each meal and aim for at least 8 glasses per day  Exercise at least 150 minutes every week.

## 2024-05-29 NOTE — Assessment & Plan Note (Signed)
Check lipids with next blood draw.  

## 2024-05-29 NOTE — Assessment & Plan Note (Signed)
 No red flags.  Lesion on the right rib all consistent with lipoma.  We did discuss referral to surgery versus watchful waiting.  He is not interested in further management at this point.  Will continue with watchful waiting.  We discussed reasons to return to care.

## 2024-05-29 NOTE — Assessment & Plan Note (Signed)
 On Synthroid  112 mcg daily.  Will refill today.  He will be coming back in a couple months for CPE and we can check lipids at that time.

## 2024-07-17 ENCOUNTER — Encounter: Payer: Self-pay | Admitting: Family Medicine

## 2024-09-18 LAB — HEPATIC FUNCTION PANEL
ALT: 21 U/L (ref 10–40)
AST: 21 (ref 14–40)
Alkaline Phosphatase: 51 (ref 25–125)
Bilirubin, Direct: 0.35 (ref 0.01–0.4)
Bilirubin, Total: 1.3

## 2024-09-18 LAB — CBC AND DIFFERENTIAL
HCT: 47 (ref 41–53)
Hemoglobin: 15.4 (ref 13.5–17.5)
Neutrophils Absolute: 3.6
Platelets: 278 K/uL (ref 150–400)
WBC: 6.2

## 2024-09-18 LAB — CBC: RBC: 5.05 (ref 3.87–5.11)

## 2024-09-18 LAB — COMPREHENSIVE METABOLIC PANEL WITH GFR
Albumin: 4.9 (ref 3.5–5.0)
eGFR: 73

## 2024-09-18 LAB — BASIC METABOLIC PANEL WITH GFR: Creatinine: 1.3 (ref 0.6–1.3)

## 2024-10-23 ENCOUNTER — Encounter: Payer: Self-pay | Admitting: Family Medicine
